# Patient Record
Sex: Female | Born: 1975 | Hispanic: Yes | Marital: Married | Smoking: Never smoker
Health system: Southern US, Community
[De-identification: ages and names within clinical notes are randomized; demographics above are authoritative.]

## PROBLEM LIST (undated history)

## (undated) DIAGNOSIS — E785 Hyperlipidemia, unspecified: Secondary | ICD-10-CM

## (undated) DIAGNOSIS — N83202 Unspecified ovarian cyst, left side: Secondary | ICD-10-CM

## (undated) DIAGNOSIS — L732 Hidradenitis suppurativa: Secondary | ICD-10-CM

## (undated) DIAGNOSIS — N83201 Unspecified ovarian cyst, right side: Secondary | ICD-10-CM

## (undated) DIAGNOSIS — I8390 Asymptomatic varicose veins of unspecified lower extremity: Secondary | ICD-10-CM

## (undated) DIAGNOSIS — J329 Chronic sinusitis, unspecified: Secondary | ICD-10-CM

## (undated) DIAGNOSIS — K219 Gastro-esophageal reflux disease without esophagitis: Secondary | ICD-10-CM

## (undated) HISTORY — DX: Chronic sinusitis, unspecified: J32.9

## (undated) HISTORY — DX: Unspecified ovarian cyst, left side: N83.202

## (undated) HISTORY — DX: Asymptomatic varicose veins of unspecified lower extremity: I83.90

## (undated) HISTORY — DX: Unspecified ovarian cyst, right side: N83.201

## (undated) HISTORY — DX: Hyperlipidemia, unspecified: E78.5

## (undated) HISTORY — PX: NO PAST SURGERIES: SHX2092

## (undated) HISTORY — DX: Hidradenitis suppurativa: L73.2

---

## 2013-05-22 ENCOUNTER — Ambulatory Visit: Payer: Self-pay

## 2013-06-02 ENCOUNTER — Other Ambulatory Visit (HOSPITAL_COMMUNITY): Payer: Self-pay | Admitting: *Deleted

## 2013-06-02 DIAGNOSIS — N6320 Unspecified lump in the left breast, unspecified quadrant: Secondary | ICD-10-CM

## 2013-06-03 ENCOUNTER — Ambulatory Visit (HOSPITAL_COMMUNITY)
Admission: RE | Admit: 2013-06-03 | Discharge: 2013-06-03 | Disposition: A | Discharge status: Home / Self Care | Payer: Self-pay | Source: Ambulatory Visit | Attending: Obstetrics and Gynecology | Admitting: Obstetrics and Gynecology

## 2013-06-03 ENCOUNTER — Encounter (INDEPENDENT_AMBULATORY_CARE_PROVIDER_SITE_OTHER): Payer: Self-pay

## 2013-06-03 ENCOUNTER — Encounter (HOSPITAL_COMMUNITY): Payer: Self-pay

## 2013-06-03 VITALS — BP 98/60 | Temp 97.7°F | Ht <= 58 in | Wt 139.2 lb

## 2013-06-03 DIAGNOSIS — Z1239 Encounter for other screening for malignant neoplasm of breast: Secondary | ICD-10-CM

## 2013-06-03 DIAGNOSIS — M79622 Pain in left upper arm: Secondary | ICD-10-CM | POA: Insufficient documentation

## 2013-06-03 NOTE — Progress Notes (Signed)
Complaints of left axillary and outer breast pain that comes and goes. Patient rated pain at a 7 out of 10 stating it is worse at night.  Pap Smear:  Pap smear not completed today. [place] June 2012 in [place] and normal per patient. Per patient no history of an abnormal Pap smear. No Pap smear results in EPIC.  Physical exam: Breasts Breasts symmetrical. No skin abnormalities bilateral breasts. No nipple retraction bilateral breasts. No nipple discharge bilateral breasts. No lymphadenopathy. No lumps palpated right breast. Palpated a possible swollen lymph node vs lump left axilla. Complaints of tenderness when palpated left axilla and outer breast. [facility] for [facility] and possible left breast ultrasound. Appointment scheduled for Wednesday, June 11, 2013 at 1515.  Pelvic/Bimanual No Pap smear completed today since last Pap smear was June 2012. Pap smear not indicated per [facility] guidelines.

## 2013-06-03 NOTE — Patient Instructions (Signed)
Taught Denise CrazeArgelia Ruiz how to perform BSE and gave educational materials to take home. Patient did not need a Pap smear today due to last Pap smear was in June 2012 per patient. Let her know [facility] will cover Pap smears every 3 years unless has a history of abnormal Pap smears. Told patient her Pap smear is due this June 2015 and she can have done for free through [facility]. Told patient she can call Sabrina to schedule her Pap smear through [facility]. [facility] for [facility] and possible left breast ultrasound. Appointment scheduled for Wednesday, June 11, 2013 at 1515. Patient aware of appointment and will be there.  Denise CrazeArgelia Ruiz verbalized understanding.  Brannock, Kathaleen Maserhristine Poll, RN 11:18 AM

## 2013-06-11 ENCOUNTER — Ambulatory Visit
Admission: RE | Admit: 2013-06-11 | Discharge: 2013-06-11 | Disposition: A | Discharge status: Home / Self Care | Payer: No Typology Code available for payment source | Source: Ambulatory Visit | Attending: Obstetrics and Gynecology | Admitting: Obstetrics and Gynecology

## 2013-06-11 DIAGNOSIS — N6320 Unspecified lump in the left breast, unspecified quadrant: Secondary | ICD-10-CM

## 2013-09-09 ENCOUNTER — Ambulatory Visit (HOSPITAL_COMMUNITY)
Admission: RE | Admit: 2013-09-09 | Discharge: 2013-09-09 | Disposition: A | Discharge status: Home / Self Care | Payer: Self-pay | Source: Ambulatory Visit | Attending: Obstetrics and Gynecology | Admitting: Obstetrics and Gynecology

## 2013-09-09 ENCOUNTER — Encounter (HOSPITAL_COMMUNITY): Payer: Self-pay

## 2013-09-09 VITALS — BP 108/64 | Temp 98.4°F | Ht <= 58 in | Wt 141.4 lb

## 2013-09-09 DIAGNOSIS — Z01419 Encounter for gynecological examination (general) (routine) without abnormal findings: Secondary | ICD-10-CM

## 2013-09-09 NOTE — Progress Notes (Signed)
Complaints of ovarian cysts.  Pap Smear:    Pap smear completed today. [place] Pap smear was June 2012 in [place] and normal per patient. Per patient has no history of an abnormal Pap smear. No Pap smear results in EPIC.     Pelvic/Bimanual   Ext Genitalia No lesions, no swelling and no discharge observed on external genitalia.         Vagina Vagina pink and normal texture. No lesions or discharge observed in vagina.          Cervix Cervix is present. Cervix pink and of normal texture. No discharge observed.     Uterus Uterus is present and palpable. Uterus in normal position and normal size.        Adnexae Bilateral ovaries present and palpable. Complaints of tenderness when palpated left ovary. No complaints of tenderness right ovary.    Rectovaginal No rectal exam completed today since patient had no rectal complaints. No skin abnormalities observed on exam.

## 2013-09-09 NOTE — Patient Instructions (Signed)
Informed patient that [facility] will cover Pap smears every 3 years unless has a history of abnormal Pap smears. Let patient know will follow up with her within the next couple weeks with results of Pap smear by phone. Told patient will [facility] for follow-up of left ovarian tenderness and that the Clinics will call her with appointment. Denise Ruiz verbalized understanding.  Brannock, Kathaleen Maserhristine Poll, RN 2:16 PM

## 2013-09-11 LAB — CYTOLOGY - PAP

## 2013-09-12 ENCOUNTER — Telehealth (HOSPITAL_COMMUNITY): Payer: Self-pay | Admitting: *Deleted

## 2013-09-12 NOTE — Telephone Encounter (Signed)
Telephoned patient at home # and discussed normal pap smear results. Next pap smear due in 3 years. Advised [facility] will contact about appointment. Patient voiced understanding. Used interpreter Delorise RoyalsJulie Sowell.

## 2013-09-19 ENCOUNTER — Encounter: Payer: Self-pay | Admitting: Obstetrics & Gynecology

## 2013-10-23 ENCOUNTER — Encounter: Payer: Self-pay | Admitting: Obstetrics & Gynecology

## 2013-10-23 ENCOUNTER — Ambulatory Visit (INDEPENDENT_AMBULATORY_CARE_PROVIDER_SITE_OTHER): Payer: Self-pay | Admitting: Obstetrics & Gynecology

## 2013-10-23 VITALS — BP 110/73 | HR 74 | Temp 98.2°F | Ht 60.0 in | Wt 142.3 lb

## 2013-10-23 DIAGNOSIS — R102 Pelvic and perineal pain unspecified side: Secondary | ICD-10-CM | POA: Insufficient documentation

## 2013-10-23 DIAGNOSIS — N949 Unspecified condition associated with female genital organs and menstrual cycle: Secondary | ICD-10-CM

## 2013-10-23 NOTE — Progress Notes (Signed)
Patient ID: Denise Ruiz, female   DOB: 1975-03-31, 38 y.o.   MRN: [id number]  Chief Complaint  Patient presents with  . Ovarian Cyst    pt states that she has light periods that last 8 days; cramping on left side when get up from lying down to up position, pt feels like she needs to lean forward due to pain    HPI Denise Raiford Simmondseralta is a 38 y.o. female.  Normal pap done by [facility]   [id number] Patient's last menstrual period was 09/20/2013.   HPI Chief Complaint  Patient presents with  . Ovarian Cyst    pt states that she has light periods that last 8 days; cramping on left side when get up from lying down to up position, pt feels like she needs to lean forward due to pain   No past medical history on file.  No past surgical history on file.  Family History  Problem Relation Age of Onset  . Heart attack Paternal Aunt   . Heart attack Paternal Uncle   . Cancer Maternal Grandmother     liver  . Heart attack Paternal Aunt     Social History History  Substance Use Topics  . Smoking status: Never Smoker   . Smokeless tobacco: Never Used  . Alcohol Use: No    No Known Allergies  Current Outpatient Prescriptions  Medication Sig Dispense Refill  . Multiple Vitamin (MULTIVITAMIN) tablet Take 1 tablet by mouth daily.       No current facility-administered medications for this visit.    Review of Systems Review of Systems  Constitutional: Negative.   Respiratory: Negative.   Gastrointestinal: Negative.   Genitourinary: Positive for menstrual problem (premenstrual pain) and pelvic pain. Negative for dysuria and urgency.    Blood pressure 110/73, pulse 74, temperature 98.2 F (36.8 C), temperature source Oral, height 5' (1.524 m), weight 142 lb 4.8 oz (64.547 kg), last menstrual period 09/20/2013.  Physical Exam Physical Exam  Constitutional: She is oriented to person, place, and time. She appears well-developed. No distress.  Pulmonary/Chest: Effort normal. No respiratory  distress.  Abdominal: Soft. She exhibits no mass. There is no tenderness.  Genitourinary:  Pelvic exam: normal external genitalia, vulva, vagina, cervix, uterus and adnexa.   Neurological: She is alert and oriented to person, place, and time.  Skin: Skin is warm and dry.  Psychiatric: She has a normal mood and affect. Her behavior is normal.    Data Reviewed Mild pelvic pain and dysmenorrhea. STD probe sent  Assessment    Mild pelvic pain and dysmenorrhea. STD probe sent    Plan    Lab result and ordered pelvic US, RTC 4 weeks        ARNOLD,JAMES 10/23/2013, 2:39 PM

## 2013-10-24 LAB — GC/CHLAMYDIA PROBE AMP
CT Probe RNA: NEGATIVE
GC Probe RNA: NEGATIVE

## 2013-10-28 ENCOUNTER — Ambulatory Visit (HOSPITAL_COMMUNITY): Payer: Self-pay

## 2014-01-26 ENCOUNTER — Encounter: Payer: Self-pay | Admitting: Obstetrics & Gynecology

## 2014-03-27 NOTE — L&D Delivery Note (Cosign Needed)
Delivery Note  Patient presented the evening of 12/23 in active labor. She progressed without augmentation. She experienced intermittent late decels and then at around 8:30 AM day of delivery recurrent late decels. Terbutaline was given and FHTs improved. She again experienced recurrent late decels and was found to have an anterior lip which she was able to push past. Meconium-stained fluid was noted at the onset of pushing. GBS positive, adequately treated.  At 10:10 AM a viable female was delivered via Vaginal, Spontaneous Delivery (Presentation: OA ).  APGAR: 6/8; weight 3745 g.   Placenta status: Intact, Spontaneous.  Cord:  with the following complications: shoulder dystocia.  Cord pH: 7.183  After delivery of the head downward traction failed to deliver the anterior shoulder. Dystocia was called, timer was started, patient was moved to bottom of her bed and placed in McRobert's position, and suprapubic pressure was applied. With downward traction the anterior shoulder was delivered and with pressure posterior to the anterior shoulder the fetus was rotated to the maternal right, which succeeded further deliver of the anterior shoulder, and then delivery proceeded normally.  Anesthesia: Epidural  Episiotomy:  none Lacerations:  none Est. Blood Loss (mL):  250 ml  Mom to postpartum.  Baby to nursery.  Denise Ruiz 03/20/2015, 10:29 AM

## 2014-06-10 ENCOUNTER — Other Ambulatory Visit (HOSPITAL_COMMUNITY): Payer: Self-pay | Admitting: *Deleted

## 2014-06-10 DIAGNOSIS — N6320 Unspecified lump in the left breast, unspecified quadrant: Secondary | ICD-10-CM

## 2014-06-11 ENCOUNTER — Encounter (HOSPITAL_COMMUNITY): Payer: Self-pay

## 2014-06-11 ENCOUNTER — Ambulatory Visit (HOSPITAL_COMMUNITY)
Admission: RE | Admit: 2014-06-11 | Discharge: 2014-06-11 | Disposition: A | Discharge status: Home / Self Care | Payer: Self-pay | Source: Ambulatory Visit | Attending: Obstetrics and Gynecology | Admitting: Obstetrics and Gynecology

## 2014-06-11 ENCOUNTER — Ambulatory Visit
Admission: RE | Admit: 2014-06-11 | Discharge: 2014-06-11 | Disposition: A | Discharge status: Home / Self Care | Payer: No Typology Code available for payment source | Source: Ambulatory Visit | Attending: Obstetrics and Gynecology | Admitting: Obstetrics and Gynecology

## 2014-06-11 VITALS — BP 110/64 | Temp 97.8°F | Ht <= 58 in | Wt 143.0 lb

## 2014-06-11 DIAGNOSIS — Z1239 Encounter for other screening for malignant neoplasm of breast: Secondary | ICD-10-CM

## 2014-06-11 DIAGNOSIS — N644 Mastodynia: Secondary | ICD-10-CM

## 2014-06-11 DIAGNOSIS — N6320 Unspecified lump in the left breast, unspecified quadrant: Secondary | ICD-10-CM

## 2014-06-11 HISTORY — DX: Hyperlipidemia, unspecified: E78.5

## 2014-06-11 NOTE — Progress Notes (Addendum)
Complaints of left breast and axillary pain x 2 months that comes and goes. Patient rates pain at a 3 out of 10.  Pap Smear:  Pap smear not completed today. [facility]. Per patient has no history of an abnormal Pap smear. Last Pap smear result is in EPIC.  Physical exam: Breasts Breasts symmetrical. No skin abnormalities bilateral breasts. No nipple retraction bilateral breasts. No nipple discharge bilateral breasts. No lymphadenopathy. No lumps palpated bilateral breasts. Complaints of left outer breast and axillary tenderness on exam. [facility]. Appointment scheduled for Thursday, June 11, 2014 at 1350.       Pelvic/Bimanual No Pap smear completed today since last Pap smear was 09/08/2013. Pap smear not indicated per BCCCP guidelines.   Used interpreter Nira ConnJulia Sowell.

## 2014-06-11 NOTE — Patient Instructions (Signed)
Education materials on self-breast awareness given. Explained to  Denise Ruiz that she did not need a Pap smear today due to last Pap smear was 09/08/2013. Let her know [facility] will cover Pap smears every 3 years unless has a history of abnormal Pap smears. [facility] for diagnostic mamm[facility]. Appointment scheduled for Thursday, June 11, 2014 at 1350. Patient aware of appointment and will be there. Denise Ruiz verbalized understanding.  Kerie Badger, Kathaleen Maserhristine Poll, RN 11:07 AM

## 2014-06-11 NOTE — Addendum Note (Signed)
Encounter addended by: Myldred Raju P Karthik Whittinghill, RN on: 06/11/2014  1:09 PM<BR>     Documentation filed: Notes Section

## 2014-08-25 ENCOUNTER — Ambulatory Visit (INDEPENDENT_AMBULATORY_CARE_PROVIDER_SITE_OTHER): Payer: Self-pay | Admitting: Obstetrics & Gynecology

## 2014-08-25 ENCOUNTER — Encounter: Payer: Self-pay | Admitting: Obstetrics & Gynecology

## 2014-08-25 VITALS — BP 112/68 | HR 71 | Temp 98.4°F | Wt 142.9 lb

## 2014-08-25 DIAGNOSIS — Z3491 Encounter for supervision of normal pregnancy, unspecified, first trimester: Secondary | ICD-10-CM

## 2014-08-25 DIAGNOSIS — O09529 Supervision of elderly multigravida, unspecified trimester: Secondary | ICD-10-CM | POA: Insufficient documentation

## 2014-08-25 DIAGNOSIS — Z3481 Encounter for supervision of other normal pregnancy, first trimester: Secondary | ICD-10-CM

## 2014-08-25 DIAGNOSIS — O3680X1 Pregnancy with inconclusive fetal viability, fetus 1: Secondary | ICD-10-CM

## 2014-08-25 LAB — POCT URINALYSIS DIP (DEVICE)
BILIRUBIN URINE: NEGATIVE
Glucose, UA: NEGATIVE mg/dL
Hgb urine dipstick: NEGATIVE
Ketones, ur: NEGATIVE mg/dL
LEUKOCYTES UA: NEGATIVE
Nitrite: NEGATIVE
PH: 5.5 (ref 5.0–8.0)
Protein, ur: NEGATIVE mg/dL
Specific Gravity, Urine: <=1.005 (ref 1.005–1.030)
Urobilinogen, UA: 0.2 mg/dL (ref 0.0–1.0)

## 2014-08-25 MED ORDER — PRENATAL VITAMINS 0.8 MG PO TABS: 1.0000 | ORAL_TABLET | Freq: Every day | ORAL | Status: DC

## 2014-08-25 NOTE — Progress Notes (Signed)
   Subjective: new OB    Denise Ruiz is a [id number] 3848w2d being seen today for her first obstetrical visit.  Her obstetrical history is significant for advanced maternal age. Patient does intend to breast feed. Pregnancy history fully reviewed.  Patient reports cramping.  Filed Vitals:   08/25/14 1511  BP: 112/68  Pulse: 71  Temp: 98.4 F (36.9 C)  Weight: 142 lb 14.4 oz (64.819 kg)    HISTORY: OB History  Gravida Para Term Preterm AB SAB TAB Ectopic Multiple Living  6 3 3  2 2    6     # Outcome Date GA Lbr Len/2nd Weight Sex Delivery Anes PTL Lv  6 Current           5 Term 10/01/10 7469w0d  7 lb 4 oz (3.289 kg) F Vag-Spont   Y  4 Term 02/16/09 5669w0d  7 lb 10 oz (3.459 kg) F Vag-Spont   Y  3 Term 11/11/02 1069w0d  6 lb 15 oz (3.147 kg) F Vag-Spont   Y  2 SAB           1 SAB              Past Medical History  Diagnosis Date  . Hyperlipemia   . Varicose vein    History reviewed. No pertinent past surgical history. Family History  Problem Relation Age of Onset  . Heart attack Paternal Aunt   . Heart attack Paternal Uncle   . Cancer Maternal Grandmother     liver  . Heart attack Paternal Aunt      Exam    Uterus:     Pelvic Exam:    Perineum: No Hemorrhoids   Vulva: normal   Vagina:  normal mucosa   pH:     Cervix: no lesions   Adnexa: normal adnexa   Bony Pelvis: average  System: Breast:  normal appearance, no masses or tenderness   Skin: normal coloration and turgor, no rashes    Neurologic: oriented, normal mood   Extremities: normal strength, tone, and muscle mass, varicose vs   HEENT PERRLA and sclera clear, anicteric   Mouth/Teeth mucous membranes moist, pharynx normal without lesions   Neck supple   Cardiovascular: regular rate and rhythm, no murmurs or gallops   Respiratory:  appears well, vitals normal, no respiratory distress, acyanotic, normal RR, neck free of mass or lymphadenopathy, chest clear, no wheezing, crepitations, rhonchi,  normal symmetric air entry   Abdomen: soft, non-tender; bowel sounds normal; no masses,  no organomegaly   Urinary: urethral meatus normal      Assessment:    Pregnancy: [id number] Patient Active Problem List   Diagnosis Date Noted  . Encounter for supervision of high risk multigravida of advanced maternal age, antepartum 08/25/2014  . Left axillary pain 06/03/2013        Plan:     Initial labs drawn. Prenatal vitamins. Problem list reviewed and updated. Genetic Screening discussed First Screen: ordered.  Ultrasound discussed; fetal survey: 18+ weeks.  Follow up in 4 weeks. 50% of 30 min visit spent on counseling and coordination of care.  Increased risk of chromosomal anomalies discussed   Gardner Servantes 08/25/2014

## 2014-08-25 NOTE — Progress Notes (Signed)
Bedside US for viability = FHR 170 per PW doppler, FM present.

## 2014-08-25 NOTE — Patient Instructions (Signed)
Segundo trimestre de embarazo (Second Trimester of Pregnancy) El segundo trimestre va desde la semana13 hasta la 28, desde el cuarto hasta el sexto mes, y suele ser el momento en el que mejor se siente. Su organismo se ha adaptado a estar embarazada y comienza a sentirse fsicamente mejor. En general, las nuseas matutinas han disminuido o han desaparecido completamente, p El segundo trimestre es tambin la poca en la que el feto se desarrolla rpidamente. Hacia el final del sexto mes, el feto mide aproximadamente 9pulgadas (23cm) y pesa alrededor de 1 libras (700g). Es probable que sienta que el beb se mueve (da pataditas) entre las 18 y 20semanas del embarazo. CAMBIOS EN EL ORGANISMO Su organismo atraviesa por muchos cambios durante el embarazo, y estos varan de una mujer a otra.   Seguir aumentando de peso. Notar que la parte baja del abdomen sobresale.  Podrn aparecer las primeras estras en las caderas, el abdomen y las mamas.  Es posible que tenga dolores de cabeza que pueden aliviarse con los medicamentos que su mdico autorice.  Tal vez tenga necesidad de orinar con ms frecuencia porque el feto est ejerciendo presin sobre la vejiga.  Debido al embarazo podr sentir acidez estomacal con frecuencia.  Puede estar estreida, ya que ciertas hormonas enlentecen los movimientos de los msculos que empujan los desechos a travs de los intestinos.  Pueden aparecer hemorroides o abultarse e hincharse las venas (venas varicosas).  Puede tener dolor de espalda que se debe al aumento de peso y a que las hormonas del embarazo relajan las articulaciones entre los huesos de la pelvis, y como consecuencia de la modificacin del peso y los msculos que mantienen el equilibrio.  Las mamas seguirn creciendo y le dolern.  Las encas pueden sangrar y estar sensibles al cepillado y al hilo dental.  Pueden aparecer zonas oscuras o manchas (cloasma, mscara del embarazo) en el rostro que  probablemente se atenuarn despus del nacimiento del beb.  Es posible que se forme una lnea oscura desde el ombligo hasta la zona del pubis (linea nigra) que probablemente se atenuarn despus del nacimiento del beb.  Tal vez haya cambios en el cabello que pueden incluir su engrosamiento, crecimiento rpido y cambios en la textura. Adems, a algunas mujeres se les cae el cabello durante o despus del embarazo, o tienen el cabello seco o fino. Lo ms probable es que el cabello se le normalice despus del nacimiento del beb. QU DEBE ESPERAR EN LAS CONSULTAS PRENATALES Durante una visita prenatal de rutina:  La pesarn para asegurarse de que usted y el feto estn creciendo normalmente.  Le tomarn la presin arterial.  Le medirn el abdomen para controlar el desarrollo del beb.  Se escucharn los latidos cardacos fetales.  Se evaluarn los resultados de los estudios solicitados en visitas anteriores. El mdico puede preguntarle lo siguiente:  Cmo se siente.  Si siente los movimientos del beb.  Si ha tenido sntomas anormales, como prdida de lquido, sangrado, dolores de cabeza intensos o clicos abdominales.  Si tiene alguna pregunta. Otros estudios que podrn realizarse durante el segundo trimestre incluyen lo siguiente:  Anlisis de sangre para detectar:  Concentraciones de hierro bajas (anemia).  Diabetes gestacional (entre la semana 24 y la 28).  Anticuerpos Rh.  Anlisis de orina para detectar infecciones, diabetes o protenas en la orina.  Una ecografa para confirmar que el beb crece y se desarrolla correctamente.  Una amniocentesis para diagnosticar posibles problemas genticos.  Estudios del feto para descartar espina   bfida y sndrome de Down. INSTRUCCIONES PARA EL CUIDADO EN EL HOGAR   Evite fumar, consumir hierbas, beber alcohol y tomar frmacos que no le hayan recetado. Estas sustancias qumicas afectan la formacin y el desarrollo del beb.  Siga  las indicaciones del mdico en relacin con el uso de medicamentos. Durante el embarazo, hay medicamentos que son seguros de tomar y otros que no.  Haga actividad fsica solo en la forma indicada por el mdico. Sentir clicos uterinos es un buen signo para detener la actividad fsica.  Contine comiendo alimentos que sanos con regularidad.  Use un sostn que le brinde buen soporte si le duelen las mamas.  No se d baos de inmersin en agua caliente, baos turcos ni saunas.  Colquese el cinturn de seguridad cuando conduzca.  No coma carne cruda ni queso sin cocinar; evite el contacto con las bandejas sanitarias de los gatos y la tierra que estos animales usan. Estos elementos contienen grmenes que pueden causar defectos congnitos en el beb.  Tome las vitaminas prenatales.  Si est estreida, pruebe un laxante suave (si el mdico lo autoriza). Consuma ms alimentos ricos en fibra, como vegetales y frutas frescos y cereales integrales. Beba gran cantidad de lquido para mantener la orina de tono claro o color amarillo plido.  Dese baos de asiento con agua tibia para aliviar el dolor o las molestias causadas por las hemorroides. Use una crema para las hemorroides si el mdico la autoriza.  Si tiene venas varicosas, use medias de descanso. Eleve los pies durante 15minutos, 3 o 4veces por da. Limite la cantidad de sal en su dieta.  No levante objetos pesados, use zapatos de tacones bajos y mantenga una buena postura.  Descanse con las piernas elevadas si tiene calambres o dolor de cintura.  Visite a su dentista si an no lo ha hecho durante el embarazo. Use un cepillo de dientes blando para higienizarse los dientes y psese el hilo dental con suavidad.  Puede seguir manteniendo relaciones sexuales, a menos que el mdico le indique lo contrario.  Concurra a todas las visitas prenatales segn las indicaciones de su mdico. SOLICITE ATENCIN MDICA SI:   Tiene mareos.  Siente  clicos leves, presin en la pelvis o dolor persistente en el abdomen.  Tiene nuseas, vmitos o diarrea persistentes.  Tiene secrecin vaginal con mal olor.  Siente dolor al orinar. SOLICITE ATENCIN MDICA DE INMEDIATO SI:   Tiene fiebre.  Tiene una prdida de lquido por la vagina.  Tiene sangrado o pequeas prdidas vaginales.  Siente dolor intenso o clicos en el abdomen.  Sube o baja de peso rpidamente.  Tiene dificultad para respirar y siente dolor de pecho.  Sbitamente se le hinchan mucho el rostro, las manos, los tobillos, los pies o las piernas.  No ha sentido los movimientos del beb durante una hora.  Siente un dolor de cabeza intenso que no se alivia con medicamentos.  Hay cambios en la visin. Document Released: 12/21/2004 Document Revised: 03/18/2013 Exit[employer] Patient Information 2015 [employer]. This information is not intended to replace advice given to you by your health care provider. Make sure you discuss any questions you have with your health care provider.  

## 2014-08-25 NOTE — Progress Notes (Signed)
Initial labs today.  C/o occasional pelvic cramping.   Advised patient start PNV.  Cheryl-lynn Alas used as interpreter for this encounter.

## 2014-08-26 LAB — PRENATAL PROFILE (SOLSTAS)
ANTIBODY SCREEN: NEGATIVE
BASOS PCT: 0 % (ref 0–1)
Basophils Absolute: 0 10*3/uL (ref 0.0–0.1)
EOS ABS: 0.2 10*3/uL (ref 0.0–0.7)
Eosinophils Relative: 2 % (ref 0–5)
HCT: 37.3 % (ref 36.0–46.0)
HIV: NONREACTIVE
Hemoglobin: 13.1 g/dL (ref 12.0–15.0)
Hepatitis B Surface Ag: NEGATIVE
Lymphocytes Relative: 25 % (ref 12–46)
Lymphs Abs: 2 10*3/uL (ref 0.7–4.0)
MCH: 30.5 pg (ref 26.0–34.0)
MCHC: 35.1 g/dL (ref 30.0–36.0)
MCV: 86.9 fL (ref 78.0–100.0)
MONOS PCT: 5 % (ref 3–12)
MPV: 9.4 fL (ref 8.6–12.4)
Monocytes Absolute: 0.4 10*3/uL (ref 0.1–1.0)
Neutro Abs: 5.4 10*3/uL (ref 1.7–7.7)
Neutrophils Relative %: 68 % (ref 43–77)
Platelets: 237 10*3/uL (ref 150–400)
RBC: 4.29 MIL/uL (ref 3.87–5.11)
RDW: 13.7 % (ref 11.5–15.5)
RH TYPE: POSITIVE
Rubella: 2.4 Index — ABNORMAL HIGH (ref <0.90)
WBC: 8 10*3/uL (ref 4.0–10.5)

## 2014-08-26 LAB — GC/CHLAMYDIA PROBE AMP
CT Probe RNA: NEGATIVE
GC PROBE AMP APTIMA: NEGATIVE

## 2014-08-27 LAB — PRESCRIPTION MONITORING PROFILE (19 PANEL)
Amphetamine/Meth: NEGATIVE ng/mL
Barbiturate Screen, Urine: NEGATIVE ng/mL
Benzodiazepine Screen, Urine: NEGATIVE ng/mL
Buprenorphine, Urine: NEGATIVE ng/mL
CARISOPRODOL, URINE: NEGATIVE ng/mL
COCAINE METABOLITES: NEGATIVE ng/mL
Cannabinoid Scrn, Ur: NEGATIVE ng/mL
Creatinine, Urine: 13.29 mg/dL — ABNORMAL LOW (ref >20.0)
FENTANYL URINE: NEGATIVE ng/mL
MDMA URINE: NEGATIVE ng/mL
Meperidine, Ur: NEGATIVE ng/mL
Methadone Screen, Urine: NEGATIVE ng/mL
Methaqualone: NEGATIVE ng/mL
Nitrites, Initial: NEGATIVE ug/mL
Opiate Screen, Urine: NEGATIVE ng/mL
Oxycodone Screen, Ur: NEGATIVE ng/mL
PH URINE, INITIAL: 6.3 pH (ref 4.5–8.9)
PHENCYCLIDINE, UR: NEGATIVE ng/mL
Propoxyphene: NEGATIVE ng/mL
Tapentadol, urine: NEGATIVE ng/mL
Tramadol Scrn, Ur: NEGATIVE ng/mL
Zolpidem, Urine: NEGATIVE ng/mL

## 2014-08-27 LAB — CULTURE, OB URINE
Colony Count: NO GROWTH
ORGANISM ID, BACTERIA: NO GROWTH

## 2014-08-28 ENCOUNTER — Other Ambulatory Visit: Payer: Self-pay | Admitting: Obstetrics & Gynecology

## 2014-08-28 ENCOUNTER — Ambulatory Visit (HOSPITAL_COMMUNITY)
Admission: RE | Admit: 2014-08-28 | Discharge: 2014-08-28 | Disposition: A | Discharge status: Home / Self Care | Payer: Self-pay | Source: Ambulatory Visit | Attending: Obstetrics & Gynecology | Admitting: Obstetrics & Gynecology

## 2014-08-28 DIAGNOSIS — Z3687 Encounter for antenatal screening for uncertain dates: Secondary | ICD-10-CM

## 2014-08-28 DIAGNOSIS — Z3481 Encounter for supervision of other normal pregnancy, first trimester: Secondary | ICD-10-CM

## 2014-08-28 DIAGNOSIS — Z3A11 11 weeks gestation of pregnancy: Secondary | ICD-10-CM | POA: Insufficient documentation

## 2014-08-28 DIAGNOSIS — Z36 Encounter for antenatal screening of mother: Secondary | ICD-10-CM | POA: Insufficient documentation

## 2014-09-01 ENCOUNTER — Other Ambulatory Visit: Payer: Self-pay | Admitting: Obstetrics & Gynecology

## 2014-09-01 DIAGNOSIS — O09529 Supervision of elderly multigravida, unspecified trimester: Secondary | ICD-10-CM

## 2014-09-01 NOTE — Progress Notes (Signed)
Pt scheduled for 1 hour gtt on 09/04/14 at 0800.

## 2014-09-04 ENCOUNTER — Other Ambulatory Visit: Payer: Self-pay

## 2014-09-04 DIAGNOSIS — Z3492 Encounter for supervision of normal pregnancy, unspecified, second trimester: Secondary | ICD-10-CM

## 2014-09-05 LAB — GLUCOSE TOLERANCE, 1 HOUR (50G) W/O FASTING: Glucose, 1 Hour GTT: 137 mg/dL (ref 70–140)

## 2014-09-07 ENCOUNTER — Telehealth: Payer: Self-pay | Admitting: General Practice

## 2014-09-07 NOTE — Telephone Encounter (Signed)
Called patient with Genella Rife for interpreter, informed patient of results and need for 3 hr gtt. Patient verbalized understanding and states she can come Wednesday 6/15 @ 7:45am. Patient aware to come in fasting. Patient had no questions

## 2014-09-09 ENCOUNTER — Other Ambulatory Visit: Payer: Self-pay

## 2014-09-09 DIAGNOSIS — R7309 Other abnormal glucose: Secondary | ICD-10-CM

## 2014-09-10 LAB — GLUCOSE TOLERANCE, 3 HOURS
GLUCOSE, 2 HOUR-GESTATIONAL: 141 mg/dL (ref 70–164)
Glucose Tolerance, 1 hour: 151 mg/dL (ref 70–189)
Glucose Tolerance, Fasting: 79 mg/dL (ref 70–104)
Glucose, GTT - 3 Hour: 115 mg/dL (ref 70–144)

## 2014-09-22 ENCOUNTER — Ambulatory Visit (INDEPENDENT_AMBULATORY_CARE_PROVIDER_SITE_OTHER): Payer: Self-pay | Admitting: Obstetrics & Gynecology

## 2014-09-22 ENCOUNTER — Encounter: Payer: Self-pay | Admitting: Obstetrics & Gynecology

## 2014-09-22 VITALS — BP 105/59 | HR 74 | Temp 98.2°F | Wt 144.5 lb

## 2014-09-22 DIAGNOSIS — O09529 Supervision of elderly multigravida, unspecified trimester: Secondary | ICD-10-CM

## 2014-09-22 DIAGNOSIS — O2200 Varicose veins of lower extremity in pregnancy, unspecified trimester: Secondary | ICD-10-CM

## 2014-09-22 LAB — POCT URINALYSIS DIP (DEVICE)
BILIRUBIN URINE: NEGATIVE
Glucose, UA: NEGATIVE mg/dL
Hgb urine dipstick: NEGATIVE
KETONES UR: NEGATIVE mg/dL
NITRITE: NEGATIVE
Protein, ur: NEGATIVE mg/dL
SPECIFIC GRAVITY, URINE: 1.02 (ref 1.005–1.030)
UROBILINOGEN UA: 0.2 mg/dL (ref 0.0–1.0)
pH: 7 (ref 5.0–8.0)

## 2014-09-22 NOTE — Progress Notes (Signed)
Subjective:  Denise Ruiz is a 39 y.o. [phone] at 10539w3d being seen today for ongoing prenatal care.  Patient reports worsening varicose veins.  Contractions: Not present.  Vag. Bleeding: None. Movement: Present. Denies leaking of fluid.   The following portions of the patient's history were reviewed and updated as appropriate: allergies, current medications, past family history, past medical history, past social history, past surgical history and problem list.   Objective:   Filed Vitals:   09/22/14 1317  BP: 105/59  Pulse: 74  Temp: 98.2 F (36.8 C)  Weight: 144 lb 8 oz (65.545 kg)    Fetal Status: Fetal Heart Rate (bpm): 145   Movement: Present     General:  Alert, oriented and cooperative. Patient is in no acute distress.  Skin: Skin is warm and dry. No rash noted.   Cardiovascular: Normal heart rate noted  Respiratory: Normal respiratory effort, no problems with respiration noted  Abdomen: Soft, gravid, appropriate for gestational age. Pain/Pressure: Present     Vaginal: Vag. Bleeding: None.       Cervix: Not evaluated        Extremities: Normal range of motion.  Edema: None  Mental Status: Normal mood and affect. Normal behavior. Normal judgment and thought content.   Urinalysis: Urine Protein: Negative Urine Glucose: Negative  Assessment and Plan:  Pregnancy: [id number] at 7339w3d  1. Encounter for supervision of high risk multigravida of advanced maternal age, antepartum  - US OB Comp + [address]; Future  2. Varicose veins- rec Ted hose or other support stockings to LE Please refer to After Visit Summary for other counseling recommendations.   Return in about 4 weeks (around 10/20/2014).   Willodean Rosenthalarolyn Harraway-Smith, MD

## 2014-09-22 NOTE — Patient Instructions (Signed)
Venas varicosas (Varicose Veins) Las venas varicosas son venas que se han agrandado y se tornan sinuosas.  CAUSAS  Este trastorno es el resultado de un malfuncionamiento de las vlvulas de las venas. Las vlvulas de las venas ayudan al retorno de la sangre desde las piernas hacia el corazn. Si estas vlvulas se daan, el flujo sanguneo retorna hacia atrs y vuelve hacia las venas de la pierna, cerca de la piel. Esto hace que las venas se agranden debido al aumento de la presin en su interior. Es ms probable que desarrollen venas varicosas las personas que estn de pie durante mucho tiempo, las mujeres embarazadas o quienes tienen sobrepeso. SNTOMAS   Venas hinchadas, tortuosas, y azuladas, que se observan ms comnmente en las piernas.  Dolor en las piernas o sensacin de pesadez. Estos sntomas pueden empeorar hacia el final del da.  Hinchazn de las piernas.  Cambios en el color de la piel. DIAGNSTICO  Su mdico puede diagnosticarle venas varicosas con un examen de las piernas. Podr recomendarle una ecografa de las venas de sus piernas.  TRATAMIENTO  La mayora de las venas varicosas pueden tratarse en casa.Sin embargo, existen otros tratamientos para personas con sntomas persistentes o que desean tratar la apariencia de las venas varicosas. Entre estos tratamientos se incluyen:   Tratamiento lser de venas varicosas muy pequeas.  Medicamentos que se inyectan en la vena. Este medicamento endurece las paredes de la vena y la cierra. Se denomina escleroterapia. Luego, deber utilizar ropa o vendajes que apliquen presin.  Ciruga. INSTRUCCIONES PARA EL CUIDADO DOMICILIARIO  No permanezca sentado o de pie en una posicin durante largos perodos. No se siente con las piernas cruzadas. Descanse con las piernas levantadas durante el da.  Use medias elsticas o de soporte. No use prendas apretadas alrededor de las piernas, pelvis o cintura.  Camine todo lo posible para aumentar  el flujo de sangre.  Levante los pies de la cama por la noche, alrededor de 5 cm.  Si se ha cortado la piel que est sobre la vena y sangra, recustese con la pierna elevada y presione con un pao limpio hasta que la sangre se detenga. Luego coloque una venda sobre el corte. Consulte a su mdico si contina sangrando o necesita una sutura. SOLICITE ATENCIN MDICA SI:  La piel que rodea el tobillo comienza a lesionarse.  Presenta dolor, enrojecimiento, sensibilidad o hinchazn en la pierna, sobre la vena.  Siente molestias y dolor en la pierna. Document Released: 12/21/2004 Document Revised: 06/05/2011 ExitCare Patient Information 2015 [employer]. This information is not intended to replace advice given to you by your health care provider. Make sure you discuss any questions you have with your health care provider.  

## 2014-09-22 NOTE — Progress Notes (Signed)
C/o occasional cramping.  Raquel mora present as interpreter.

## 2014-10-30 ENCOUNTER — Ambulatory Visit (HOSPITAL_COMMUNITY)
Admission: RE | Admit: 2014-10-30 | Discharge: 2014-10-30 | Disposition: A | Discharge status: Home / Self Care | Payer: Self-pay | Source: Ambulatory Visit | Attending: Obstetrics & Gynecology | Admitting: Obstetrics & Gynecology

## 2014-10-30 ENCOUNTER — Other Ambulatory Visit: Payer: Self-pay | Admitting: Obstetrics & Gynecology

## 2014-10-30 DIAGNOSIS — O09529 Supervision of elderly multigravida, unspecified trimester: Secondary | ICD-10-CM | POA: Insufficient documentation

## 2014-11-11 ENCOUNTER — Ambulatory Visit (INDEPENDENT_AMBULATORY_CARE_PROVIDER_SITE_OTHER): Payer: Self-pay | Admitting: Family Medicine

## 2014-11-11 ENCOUNTER — Encounter: Payer: Self-pay | Admitting: Obstetrics & Gynecology

## 2014-11-11 VITALS — BP 108/68 | HR 87 | Wt 150.6 lb

## 2014-11-11 DIAGNOSIS — R12 Heartburn: Secondary | ICD-10-CM

## 2014-11-11 DIAGNOSIS — O26899 Other specified pregnancy related conditions, unspecified trimester: Secondary | ICD-10-CM

## 2014-11-11 DIAGNOSIS — O09529 Supervision of elderly multigravida, unspecified trimester: Secondary | ICD-10-CM

## 2014-11-11 LAB — POCT URINALYSIS DIP (DEVICE)
Bilirubin Urine: NEGATIVE
Glucose, UA: NEGATIVE mg/dL
Hgb urine dipstick: NEGATIVE
Ketones, ur: NEGATIVE mg/dL
LEUKOCYTES UA: NEGATIVE
NITRITE: NEGATIVE
Protein, ur: NEGATIVE mg/dL
SPECIFIC GRAVITY, URINE: 1.015 (ref 1.005–1.030)
UROBILINOGEN UA: 0.2 mg/dL (ref 0.0–1.0)
pH: 7 (ref 5.0–8.0)

## 2014-11-11 NOTE — Patient Instructions (Signed)
Segundo trimestre de embarazo (Second Trimester of Pregnancy) El segundo trimestre va desde la semana13 hasta la 28, desde el cuarto hasta el sexto mes, y suele ser el momento en el que mejor se siente. Su organismo se ha adaptado a estar embarazada y comienza a sentirse fsicamente mejor. En general, las nuseas matutinas han disminuido o han desaparecido completamente, p El segundo trimestre es tambin la poca en la que el feto se desarrolla rpidamente. Hacia el final del sexto mes, el feto mide aproximadamente 9pulgadas (23cm) y pesa alrededor de 1 libras (700g). Es probable que sienta que el beb se mueve (da pataditas) entre las 18 y 20semanas del embarazo. CAMBIOS EN EL ORGANISMO Su organismo atraviesa por muchos cambios durante el embarazo, y estos varan de una mujer a otra.   Seguir aumentando de peso. Notar que la parte baja del abdomen sobresale.  Podrn aparecer las primeras estras en las caderas, el abdomen y las mamas.  Es posible que tenga dolores de cabeza que pueden aliviarse con los medicamentos que su mdico autorice.  Tal vez tenga necesidad de orinar con ms frecuencia porque el feto est ejerciendo presin sobre la vejiga.  Debido al embarazo podr sentir acidez estomacal con frecuencia.  Puede estar estreida, ya que ciertas hormonas enlentecen los movimientos de los msculos que empujan los desechos a travs de los intestinos.  Pueden aparecer hemorroides o abultarse e hincharse las venas (venas varicosas).  Puede tener dolor de espalda que se debe al aumento de peso y a que las hormonas del embarazo relajan las articulaciones entre los huesos de la pelvis, y como consecuencia de la modificacin del peso y los msculos que mantienen el equilibrio.  Las mamas seguirn creciendo y le dolern.  Las encas pueden sangrar y estar sensibles al cepillado y al hilo dental.  Pueden aparecer zonas oscuras o manchas (cloasma, mscara del embarazo) en el rostro que  probablemente se atenuarn despus del nacimiento del beb.  Es posible que se forme una lnea oscura desde el ombligo hasta la zona del pubis (linea nigra) que probablemente se atenuarn despus del nacimiento del beb.  Tal vez haya cambios en el cabello que pueden incluir su engrosamiento, crecimiento rpido y cambios en la textura. Adems, a algunas mujeres se les cae el cabello durante o despus del embarazo, o tienen el cabello seco o fino. Lo ms probable es que el cabello se le normalice despus del nacimiento del beb. QU DEBE ESPERAR EN LAS CONSULTAS PRENATALES Durante una visita prenatal de rutina:  La pesarn para asegurarse de que usted y el feto estn creciendo normalmente.  Le tomarn la presin arterial.  Le medirn el abdomen para controlar el desarrollo del beb.  Se escucharn los latidos cardacos fetales.  Se evaluarn los resultados de los estudios solicitados en visitas anteriores. El mdico puede preguntarle lo siguiente:  Cmo se siente.  Si siente los movimientos del beb.  Si ha tenido sntomas anormales, como prdida de lquido, sangrado, dolores de cabeza intensos o clicos abdominales.  Si tiene alguna pregunta. Otros estudios que podrn realizarse durante el segundo trimestre incluyen lo siguiente:  Anlisis de sangre para detectar:  Concentraciones de hierro bajas (anemia).  Diabetes gestacional (entre la semana 24 y la 28).  Anticuerpos Rh.  Anlisis de orina para detectar infecciones, diabetes o protenas en la orina.  Una ecografa para confirmar que el beb crece y se desarrolla correctamente.  Una amniocentesis para diagnosticar posibles problemas genticos.  Estudios del feto para descartar espina   bfida y sndrome de Down. INSTRUCCIONES PARA EL CUIDADO EN EL HOGAR   Evite fumar, consumir hierbas, beber alcohol y tomar frmacos que no le hayan recetado. Estas sustancias qumicas afectan la formacin y el desarrollo del beb.  Siga  las indicaciones del mdico en relacin con el uso de medicamentos. Durante el embarazo, hay medicamentos que son seguros de tomar y otros que no.  Haga actividad fsica solo en la forma indicada por el mdico. Sentir clicos uterinos es un buen signo para detener la actividad fsica.  Contine comiendo alimentos que sanos con regularidad.  Use un sostn que le brinde buen soporte si le duelen las mamas.  No se d baos de inmersin en agua caliente, baos turcos ni saunas.  Colquese el cinturn de seguridad cuando conduzca.  No coma carne cruda ni queso sin cocinar; evite el contacto con las bandejas sanitarias de los gatos y la tierra que estos animales usan. Estos elementos contienen grmenes que pueden causar defectos congnitos en el beb.  Tome las vitaminas prenatales.  Si est estreida, pruebe un laxante suave (si el mdico lo autoriza). Consuma ms alimentos ricos en fibra, como vegetales y frutas frescos y cereales integrales. Beba gran cantidad de lquido para mantener la orina de tono claro o color amarillo plido.  Dese baos de asiento con agua tibia para aliviar el dolor o las molestias causadas por las hemorroides. Use una crema para las hemorroides si el mdico la autoriza.  Si tiene venas varicosas, use medias de descanso. Eleve los pies durante 15minutos, 3 o 4veces por da. Limite la cantidad de sal en su dieta.  No levante objetos pesados, use zapatos de tacones bajos y mantenga una buena postura.  Descanse con las piernas elevadas si tiene calambres o dolor de cintura.  Visite a su dentista si an no lo ha hecho durante el embarazo. Use un cepillo de dientes blando para higienizarse los dientes y psese el hilo dental con suavidad.  Puede seguir manteniendo relaciones sexuales, a menos que el mdico le indique lo contrario.  Concurra a todas las visitas prenatales segn las indicaciones de su mdico. SOLICITE ATENCIN MDICA SI:   Tiene mareos.  Siente  clicos leves, presin en la pelvis o dolor persistente en el abdomen.  Tiene nuseas, vmitos o diarrea persistentes.  Tiene secrecin vaginal con mal olor.  Siente dolor al orinar. SOLICITE ATENCIN MDICA DE INMEDIATO SI:   Tiene fiebre.  Tiene una prdida de lquido por la vagina.  Tiene sangrado o pequeas prdidas vaginales.  Siente dolor intenso o clicos en el abdomen.  Sube o baja de peso rpidamente.  Tiene dificultad para respirar y siente dolor de pecho.  Sbitamente se le hinchan mucho el rostro, las manos, los tobillos, los pies o las piernas.  No ha sentido los movimientos del beb durante una hora.  Siente un dolor de cabeza intenso que no se alivia con medicamentos.  Hay cambios en la visin. Document Released: 12/21/2004 Document Revised: 03/18/2013 Exit[employer] Patient Information 2015 [employer]. This information is not intended to replace advice given to you by your health care provider. Make sure you discuss any questions you have with your health care provider.  

## 2014-11-11 NOTE — Progress Notes (Signed)
Subjective:  Denise Ruiz is a 39 y.o. [phone] at [redacted]w[redacted]d being seen today for ongoing prenatal care.  Patient reports heartburn that responds with TUMS.  Contractions: Not present.  Vag. Bleeding: None. Movement: Present. Denies leaking of fluid.   The following portions of the patient's history were reviewed and updated as appropriate: allergies, current medications, past family history, past medical history, past social history, past surgical history and problem list.   Objective:   Filed Vitals:   11/11/14 1338  BP: 108/68  Pulse: 87  Weight: 150 lb 9.6 oz (68.312 kg)    Fetal Status: Fetal Heart Rate (bpm): 140   Movement: Present     General:  Alert, oriented and cooperative. Patient is in no acute distress.  Skin: Skin is warm and dry. No rash noted.   Cardiovascular: Normal heart rate noted  Respiratory: Normal respiratory effort, no problems with respiration noted  Abdomen: Soft, gravid, appropriate for gestational age. Pain/Pressure: Present     Pelvic: Vag. Bleeding: None     Cervical exam deferred        Extremities: Normal range of motion.  Edema: None  Mental Status: Normal mood and affect. Normal behavior. Normal judgment and thought content.   Urinalysis:      Assessment and Plan:  Pregnancy: [id number] at [redacted]w[redacted]d  1. Encounter for supervision of high risk multigravida of advanced maternal age, antepartum FHT and fundal height normal.  2. Heartburn in pregnancy, unspecified trimester TUMs  Preterm labor symptoms and general obstetric precautions including but not limited to vaginal bleeding, contractions, leaking of fluid and fetal movement were reviewed in detail with the patient. Please refer to After Visit Summary for other counseling recommendations.  No Follow-up on file.   Levie Heritage, DO

## 2014-11-11 NOTE — Progress Notes (Signed)
Reviewed breast feeding tip of the week with patient.  

## 2014-12-10 ENCOUNTER — Encounter: Payer: Self-pay | Admitting: Obstetrics & Gynecology

## 2014-12-11 ENCOUNTER — Ambulatory Visit (INDEPENDENT_AMBULATORY_CARE_PROVIDER_SITE_OTHER): Payer: Self-pay | Admitting: Obstetrics & Gynecology

## 2014-12-11 ENCOUNTER — Encounter: Payer: Self-pay | Admitting: Obstetrics & Gynecology

## 2014-12-11 VITALS — BP 93/61 | HR 83 | Temp 97.9°F | Wt 154.8 lb

## 2014-12-11 DIAGNOSIS — O09529 Supervision of elderly multigravida, unspecified trimester: Secondary | ICD-10-CM

## 2014-12-11 DIAGNOSIS — Z23 Encounter for immunization: Secondary | ICD-10-CM

## 2014-12-11 DIAGNOSIS — O09522 Supervision of elderly multigravida, second trimester: Secondary | ICD-10-CM

## 2014-12-11 LAB — POCT URINALYSIS DIP (DEVICE)
BILIRUBIN URINE: NEGATIVE
Glucose, UA: NEGATIVE mg/dL
HGB URINE DIPSTICK: NEGATIVE
KETONES UR: NEGATIVE mg/dL
Leukocytes, UA: NEGATIVE
Nitrite: NEGATIVE
PH: 7 (ref 5.0–8.0)
PROTEIN: NEGATIVE mg/dL
SPECIFIC GRAVITY, URINE: 1.015 (ref 1.005–1.030)
Urobilinogen, UA: 0.2 mg/dL (ref 0.0–1.0)

## 2014-12-11 LAB — GLUCOSE TOLERANCE, 1 HOUR (50G) W/O FASTING: Glucose, 1 Hour GTT: 121 mg/dL (ref 70–140)

## 2014-12-11 LAB — CBC
HEMATOCRIT: 31.5 % — AB (ref 36.0–46.0)
HEMOGLOBIN: 11 g/dL — AB (ref 12.0–15.0)
MCH: 30.6 pg (ref 26.0–34.0)
MCHC: 34.9 g/dL (ref 30.0–36.0)
MCV: 87.7 fL (ref 78.0–100.0)
MPV: 9.2 fL (ref 8.6–12.4)
Platelets: 212 10*3/uL (ref 150–400)
RBC: 3.59 MIL/uL — ABNORMAL LOW (ref 3.87–5.11)
RDW: 14.1 % (ref 11.5–15.5)
WBC: 8.2 10*3/uL (ref 4.0–10.5)

## 2014-12-11 MED ORDER — TETANUS-DIPHTH-ACELL PERTUSSIS 5-2.5-18.5 LF-MCG/0.5 IM SUSP
0.5000 mL | Freq: Once | INTRAMUSCULAR | Status: AC
  Administered 2014-12-11: 0.5 mL via INTRAMUSCULAR

## 2014-12-11 MED ORDER — FAMOTIDINE 40 MG PO TABS: 40.0000 mg | ORAL_TABLET | Freq: Every evening | ORAL | Status: DC

## 2014-12-11 NOTE — Progress Notes (Signed)
Interpreter Clent Ridges  28 wk packet given Tdap vaccine consented and info given

## 2014-12-11 NOTE — Patient Instructions (Signed)
Tercer trimestre del embarazo  (Third Trimester of Pregnancy)  El tercer trimestre del embarazo abarca desde la semana 29 hasta la semana 42, desde el 7 mes hasta el 9. En este trimestre el beb (feto) se desarrolla muy rpidamente. Hacia el final del noveno mes, el beb que an no ha nacido mide alrededor de 20 pulgadas (45 cm) de largo. Y pesa entre 6 y 10 libras (2,700 y 4,500 kg).  CUIDADOS EN EL HOGAR   Evite fumar, consumir hierbas y beber alcohol. Evite los frmacos que no apruebe el mdico.  Slo tome los medicamentos que le haya indicado su mdico. Algunos medicamentos son seguros para tomar durante el embarazo y otros no lo son.  Haga ejercicios slo como le indique el mdico. Deje de hacer ejercicios si comienza a tener clicos.  Haga comidas regulares y sanas.  Use un sostn que le brinde buen soporte si sus mamas estn sensibles.  No utilice la baera con agua caliente, baos turcos y saunas.  Colquese el cinturn de seguridad cuando conduzca.  Evite comer carne cruda y el contacto con los utensilios y desperdicios de los gatos.  Tome las vitaminas indicadas para la etapa prenatal.  Trate de tomar medicamentos para mover el intestino (laxantes) segn lo necesario y si su mdico la autoriza. Consuma ms fibra comiendo frutas y vegetales frescos y granos enteros. Beba gran cantidad de lquido para mantener el pis (orina) de tono claro o amarillo plido.  Tome baos de agua tibia (baos de asiento) para calmar el dolor o las molestias causadas por las hemorroides. Use una crema para las hemorroides si el mdico la autoriza.  Si tiene venas hinchadas y abultadas (venas varicosas), use medias de soporte. Eleve (levante) los pies durante 15 minutos, 3 o 4 veces por da. Limite el consumo de sal en su dieta.  Evite levantar objetos pesados, usar tacones altos y sintese derecha.  Descanse con las piernas elevadas si tiene calambres o dolor de cintura.  Visite a su dentista  si no lo ha hecho durante el embarazo. Use un cepillo de dientes blando para higienizarse los dientes. Use suavemente el hilo dental.  Puede tener sexo (relaciones sexuales) siempre que el mdico la autorice.  No viaje por largas distancias si puede evitarlo. Slo hgalo con la aprobacin de su mdico.  Haga el curso pre parto.  Practique conducir hasta el hospital.  Prepare el bolso que llevar.  Prepare la habitacin del beb.  Concurra a los controles mdicos. SOLICITE AYUDA SI:   No est segura si est en trabajo de parto o ha roto la bolsa de aguas.  Tiene mareos.  Siente clicos intensos o presin en la zona baja del vientre (abdomen).  Siente un dolor persistente en la zona del vientre.  Tiene malestar estomacal (nuseas), devuelve (vomita), o tiene deposiciones acuosas (diarrea).  Advierte un olor ftido que proviene de la vagina.  Siente dolor al hacer pis (orinar). SOLICITE AYUDA DE INMEDIATO SI:   Tiene fiebre.  Pierde lquido o sangre por la vagina.  Tiene sangrando o pequeas prdidas vaginales.  Siente dolor intenso o clicos en el abdomen.  Sube o baja de peso rpidamente.  Tiene dificultad para respirar o siente dolor en el pecho.  Sbitamente se le hinchan el rostro, las manos, los tobillos, los pies o las piernas.  No ha sentido los movimientos del beb durante una hora.  Siente un dolor de cabeza intenso que no se alivia con medicamentos.  Su visin se modifica. Document   Released: 11/13/2012 ExitCare Patient Information 2015 [employer]. This information is not intended to replace advice given to you by your health care provider. Make sure you discuss any questions you have with your health care provider.  

## 2014-12-11 NOTE — Progress Notes (Signed)
Still with reflux sx  Subjective:  Denise Ruiz is a 39 y.o. [phone] at [redacted]w[redacted]d being seen today for ongoing prenatal care.  Patient reports heartburn.  Contractions: Not present.  Vag. Bleeding: None. Movement: Present. Denies leaking of fluid.   The following portions of the patient's history were reviewed and updated as appropriate: allergies, current medications, past family history, past medical history, past social history, past surgical history and problem list.   Objective:   Filed Vitals:   12/11/14 0948  BP: 93/61  Pulse: 83  Temp: 97.9 F (36.6 C)  Weight: 154 lb 12.8 oz (70.217 kg)    Fetal Status:     Movement: Present     General:  Alert, oriented and cooperative. Patient is in no acute distress.  Skin: Skin is warm and dry. No rash noted.   Cardiovascular: Normal heart rate noted  Respiratory: Normal respiratory effort, no problems with respiration noted  Abdomen: Soft, gravid, appropriate for gestational age. Pain/Pressure: Present     Pelvic: Vag. Bleeding: None Vag D/C Character: White   Cervical exam deferred        Extremities: Normal range of motion.  Edema: None  Mental Status: Normal mood and affect. Normal behavior. Normal judgment and thought content.   Urinalysis: Urine Protein: Negative Urine Glucose: Negative  Assessment and Plan:  Pregnancy: [id number] at [redacted]w[redacted]d  1. Encounter for supervision of high risk multigravida of advanced maternal age, antepartum  - CBC - RPR - HIV antibody (with reflex) - Glucose Tolerance, 1 HR (50g)  2. Flu vaccine need today  Preterm labor symptoms and general obstetric precautions including but not limited to vaginal bleeding, contractions, leaking of fluid and fetal movement were reviewed in detail with the patient. Please refer to After Visit Summary for other counseling recommendations.  Return in about 2 weeks (around 12/25/2014).   Adam Phenix, MD

## 2014-12-12 LAB — HIV ANTIBODY (ROUTINE TESTING W REFLEX): HIV: NONREACTIVE

## 2014-12-12 LAB — RPR

## 2015-01-06 ENCOUNTER — Ambulatory Visit (INDEPENDENT_AMBULATORY_CARE_PROVIDER_SITE_OTHER): Payer: Self-pay | Admitting: Family

## 2015-01-06 VITALS — BP 101/71 | HR 87 | Temp 97.3°F | Wt 153.8 lb

## 2015-01-06 DIAGNOSIS — O2200 Varicose veins of lower extremity in pregnancy, unspecified trimester: Secondary | ICD-10-CM

## 2015-01-06 DIAGNOSIS — O09529 Supervision of elderly multigravida, unspecified trimester: Secondary | ICD-10-CM

## 2015-01-06 DIAGNOSIS — O2203 Varicose veins of lower extremity in pregnancy, third trimester: Secondary | ICD-10-CM

## 2015-01-06 LAB — POCT URINALYSIS DIP (DEVICE)
Bilirubin Urine: NEGATIVE
Glucose, UA: NEGATIVE mg/dL
HGB URINE DIPSTICK: NEGATIVE
Ketones, ur: NEGATIVE mg/dL
NITRITE: NEGATIVE
PH: 7.5 (ref 5.0–8.0)
PROTEIN: NEGATIVE mg/dL
SPECIFIC GRAVITY, URINE: 1.015 (ref 1.005–1.030)
UROBILINOGEN UA: 0.2 mg/dL (ref 0.0–1.0)

## 2015-01-06 NOTE — Progress Notes (Signed)
Spanish interpreter Delphina CahillSusan Ross   Pressure- vaginal pressure when she sits  Pain- back pain

## 2015-01-06 NOTE — Progress Notes (Signed)
Subjective:  Denise Ruiz is a 39 y.o. [phone] at 2610w4d being seen today for ongoing prenatal care.  Patient reports pelvic pressure when sitting and occasional back pain..  Contractions: Irritability.  Vag. Bleeding: None. Movement: Present. Denies leaking of fluid.   The following portions of the patient's history were reviewed and updated as appropriate: allergies, current medications, past family history, past medical history, past social history, past surgical history and problem list. Problem list updated.  Objective:   Filed Vitals:   01/06/15 1139  BP: 101/71  Pulse: 87  Temp: 97.3 F (36.3 C)  Weight: 153 lb 12.8 oz (69.763 kg)    Fetal Status: Fetal Heart Rate (bpm): 144 Fundal Height: 31 cm Movement: Present     General:  Alert, oriented and cooperative. Patient is in no acute distress.  Skin: Skin is warm and dry. No rash noted.   Cardiovascular: Normal heart rate noted  Respiratory: Normal respiratory effort, no problems with respiration noted  Abdomen: Soft, gravid, appropriate for gestational age. Pain/Pressure: Present     Pelvic: Vag. Bleeding: None Vag D/C Character: White   Cervical exam deferred        Extremities: Normal range of motion.  Edema: None  Mental Status: Normal mood and affect. Normal behavior. Normal judgment and thought content.   Urinalysis: Urine Protein: Negative Urine Glucose: Negative  Assessment and Plan:  Pregnancy: [id number] at 5910w4d  1. Encounter for supervision of high risk multigravida of advanced maternal age, antepartum - Reviewed 1 hr results - Tylenol for back  Preterm labor symptoms and general obstetric precautions including but not limited to vaginal bleeding, contractions, leaking of fluid and fetal movement were reviewed in detail with the patient. Please refer to After Visit Summary for other counseling recommendations.  Return in about 2 weeks (around 01/20/2015).   Eino FarberWalidah Kennith GainN Karim, CNM

## 2015-01-20 ENCOUNTER — Ambulatory Visit (INDEPENDENT_AMBULATORY_CARE_PROVIDER_SITE_OTHER): Payer: Self-pay | Admitting: Advanced Practice Midwife

## 2015-01-20 ENCOUNTER — Encounter: Payer: Self-pay | Admitting: Advanced Practice Midwife

## 2015-01-20 VITALS — BP 101/55 | HR 83 | Temp 98.6°F | Wt 158.3 lb

## 2015-01-20 DIAGNOSIS — K219 Gastro-esophageal reflux disease without esophagitis: Secondary | ICD-10-CM

## 2015-01-20 DIAGNOSIS — O09523 Supervision of elderly multigravida, third trimester: Secondary | ICD-10-CM

## 2015-01-20 DIAGNOSIS — Z23 Encounter for immunization: Secondary | ICD-10-CM

## 2015-01-20 DIAGNOSIS — O09529 Supervision of elderly multigravida, unspecified trimester: Secondary | ICD-10-CM | POA: Insufficient documentation

## 2015-01-20 DIAGNOSIS — O99613 Diseases of the digestive system complicating pregnancy, third trimester: Secondary | ICD-10-CM

## 2015-01-20 DIAGNOSIS — L259 Unspecified contact dermatitis, unspecified cause: Secondary | ICD-10-CM

## 2015-01-20 LAB — POCT URINALYSIS DIP (DEVICE)
BILIRUBIN URINE: NEGATIVE
Glucose, UA: NEGATIVE mg/dL
HGB URINE DIPSTICK: NEGATIVE
Ketones, ur: NEGATIVE mg/dL
Leukocytes, UA: NEGATIVE
NITRITE: NEGATIVE
PH: 7 (ref 5.0–8.0)
PROTEIN: NEGATIVE mg/dL
Specific Gravity, Urine: 1.015 (ref 1.005–1.030)
Urobilinogen, UA: 0.2 mg/dL (ref 0.0–1.0)

## 2015-01-20 MED ORDER — FAMOTIDINE 40 MG PO TABS: 40.0000 mg | ORAL_TABLET | Freq: Every day | ORAL | Status: DC

## 2015-01-20 MED ORDER — TRIAMCINOLONE ACETONIDE 0.1 % EX OINT: 1.0000 "application " | TOPICAL_OINTMENT | Freq: Two times a day (BID) | CUTANEOUS | Status: DC

## 2015-01-20 MED ORDER — FAMOTIDINE 40 MG PO TABS: 20.0000 mg | ORAL_TABLET | Freq: Every day | ORAL | Status: DC

## 2015-01-20 NOTE — Progress Notes (Signed)
Breastfeeding tip of the week reviewed Interpreter present

## 2015-01-20 NOTE — Patient Instructions (Signed)
Dermatitis de contacto °(Contact Dermatitis) °La dermatitis es el enrojecimiento, el dolor y la hinchazón (inflamación) de la piel. La dermatitis de contacto es una reacción a ciertas sustancias que entran en contacto con la piel. Hay dos tipos de dermatitis de contacto:  °· Dermatitis de contacto irritativa. La causa de este tipo de dermatitis es algo que irrita la piel, como las manos secas por lavarlas en exceso. Este tipo no requiere la exposición previa a la sustancia que causó la reacción. Este tipo es más frecuente. °· Dermatitis alérgica por contacto. La causa de este tipo de dermatitis es una sustancia a la cual se es alérgico, como una alergia al níquel o a la hiedra venenosa. Este tipo solo ocurre si ha estado expuesto anteriormente a la sustancia (alérgeno). Al repetir la exposición, el organismo reacciona a la sustancia. Este tipo es menos frecuente. °CAUSAS  °Muchas sustancias diferentes pueden causar dermatitis de contacto. La causa más frecuente de la dermatitis de contacto irritativa es la exposición a lo siguiente:  °· Maquillaje.   °· Jabones perfumados.   °· Detergentes.   °· Lavandina.   °· Ácidos.   °· Sales metálicas, como el níquel.   °Las causas de la dermatitis alérgica son las siguientes:  °· Plantas venenosas.   °· Productos químicos.   °· Alhajas.   °· Látex.   °· Medicamentos.   °· Conservantes que se utilizan en determinados productos, como la ropa.   °FACTORES DE RIESGO °Es más probable que esta afección se manifieste en:  °· Las personas que tienen trabajos que las exponen a irritantes o a alérgenos. °· Las personas que tienen determinadas enfermedades, por ejemplo, asma o eccema.   °SÍNTOMAS  °Los síntomas de esta afección pueden presentarse en cualquier parte del cuerpo con la que usted toque el irritante o donde la sustancia irritante lo haya tocado. Algunos síntomas son los siguientes: °· Sequedad o descamación.   °· Enrojecimiento.   °· Grietas.   °· Picazón.   °· Dolor o  sensación de ardor.   °· Ampollas. °· Secreción de pequeñas cantidades de sangre o de líquido transparente que emanan de las grietas de la piel. °En el caso de la dermatitis de contacto alérgica, puede haber hinchazón solo en algunas partes del cuerpo, como la boca o los genitales.  °DIAGNÓSTICO  °Esta afección se diagnostica mediante la historia clínica y un examen físico. Se puede realizar una prueba del parche para ayudar a determinar la causa. Si la afección guarda relación con el trabajo, tal vez deba consultar a un especialista en medicina ocupacional. °TRATAMIENTO °El tratamiento de esta afección incluye determinar la causa de la reacción y proteger la piel de nuevos contactos. El tratamiento también puede incluir lo siguiente:  °· Cremas o ungüentos con corticoides. En los casos más graves será necesario aplicar corticoides por vía oral. °· Ungüentos con antibióticos o antibacterianos, si hay una infección en la piel. °· Antihistamínicos en forma de loción o por vía oral para calmar la picazón. °· Un vendaje. °INSTRUCCIONES PARA EL CUIDADO EN EL HOGAR °Cuidado de la piel  °· Huméctese la piel según sea necesario.   °· Aplique compresas frías en las zonas afectadas. °· Trate de tomar un baño con lo siguiente: °¨ Sales de Epsom. Siga las instrucciones del envase. Puede conseguirlas en la tienda de comestibles o la farmacia local. °¨ Bicarbonato de sodio. Vierta un poco en la bañera como se lo haya indicado el médico. °¨ Avena coloidal. Siga las instrucciones del envase. Puede conseguirla en la tienda de comestibles o la farmacia local. °· Intente colocarse una pasta de bicarbonato de   sodio sobre la piel. Agregue agua al bicarbonato hasta que tenga la consistencia de una pasta. °· No se rasque la piel. °· Báñese con menos frecuencia, por ejemplo, cada dos días. °· Báñese con agua templada. No use agua caliente. °Medicamentos  °· Tome o aplíquese los medicamentos de venta libre y recetados solamente como se lo  haya indicado el médico.   °· Si le recetaron un antibiótico, tómelo o aplíqueselo como se lo haya indicado el médico. No deje de usar el antibiótico aunque la afección empiece a mejorar. °Instrucciones generales  °· Concurra a todas las visitas de control como se lo haya indicado el médico. Esto es importante. °· Evite la sustancia que ha causado la erupción. Si no sabe qué la causó, lleve un diario para tratar de identificar la causa. Escriba los siguientes datos: °¨ Lo que come. °¨ Los cosméticos que utiliza. °¨ Lo que bebe. °¨ Lo que llevó puesto en la zona afectada. Esto incluye las alhajas. °· Si le indicaron que use un vendaje, cuídelo como se lo haya indicado el médico. Esto incluye saber cuándo cambiarlo y cuándo quitárselo. °SOLICITE ATENCIÓN MÉDICA SI:  °· La afección no mejora con tratamiento. °· La afección empeora. °· Observa signos de infección, como hinchazón, sensibilidad, enrojecimiento, dolor o calor en la zona afectada. °· Tiene fiebre. °· Aparecen nuevos síntomas. °SOLICITE ATENCIÓN MÉDICA DE INMEDIATO SI:  °· Tiene dolor de cabeza intenso, dolor o rigidez en el cuello. °· Vomita. °· Se siente muy somnoliento. °· Nota una línea roja en la piel que sale de la zona afectada. °· El hueso o la articulación que se encuentran por debajo de la zona afectada le duelen después de que la piel se haya curado. °· La zona afectada se oscurece. °· Tiene dificultad para respirar. °  °Esta información no tiene como fin reemplazar el consejo del médico. Asegúrese de hacerle al médico cualquier pregunta que tenga. °  °Document Released: 12/21/2004 Document Revised: 12/02/2014 °Elsevier Interactive Patient Education ©2016 Else[employer] Inc. ° °

## 2015-01-21 NOTE — Progress Notes (Signed)
Subjective:  Denise Ruiz is a 39 y.o. [phone] at 5119w5d being seen today for ongoing prenatal care.  Patient reports vulvar itching, pelvic pressure. Denies urinary complaints, vaginal discharge, LOF or VB.  Contractions: Irritability.  Vag. Bleeding: None. Movement: Present. Denies leaking of fluid.   The following portions of the patient's history were reviewed and updated as appropriate: allergies, current medications, past family history, past medical history, past social history, past surgical history and problem list. Problem list updated.  Objective:   Filed Vitals:   01/20/15 1120  BP: 101/55  Pulse: 83  Temp: 98.6 F (37 C)  Weight: 158 lb 4.8 oz (71.804 kg)    Fetal Status: Fetal Heart Rate (bpm): 155   Movement: Present     General:  Alert, oriented and cooperative. Patient is in no acute distress.  Skin: Skin is warm and dry. No rash noted.   Cardiovascular: Normal heart rate noted  Respiratory: Normal respiratory effort, no problems with respiration noted  Abdomen: Soft, gravid, appropriate for gestational age. Pain/Pressure: Present     Pelvic: Vag. Bleeding: None Vag D/C Character: White   Cervical exam offered, declined         Vulva: No rash, discharge or lesions. Vulvar varicosities presents   Extremities: Normal range of motion.  Edema: None  Mental Status: Normal mood and affect. Normal behavior. Normal judgment and thought content.   Urinalysis: Urine Protein: Negative Urine Glucose: Negative  Assessment and Plan:  Pregnancy: [id number] at 6019w5d  1. AMA (advanced maternal age) multigravida 35+, third trimester   2. Encounter for supervision of high risk multigravida of advanced maternal age, antepartum    3. Flu vaccine need    4. Gastroesophageal reflux during pregnancy in third trimester, antepartum  - famotidine (PEPCID) 40 MG tablet; Take 1 tablet (40 mg total) by mouth daily.  Dispense: 30 tablet; Refill: 2  5. Contact dermatitis  -  triamcinolone ointment (KENALOG) 0.1 %; Apply 1 application topically 2 (two) times daily.  Dispense: 30 g; Refill: 0  Preterm labor symptoms and general obstetric precautions including but not limited to vaginal bleeding, contractions, leaking of fluid and fetal movement were reviewed in detail with the patient. Please refer to After Visit Summary for other counseling recommendations.  Maternity support belt, comfort measures. Return in about 2 weeks (around 02/03/2015).   Dorathy KinsmanVirginia Sahmya Arai, CNM

## 2015-02-03 ENCOUNTER — Ambulatory Visit (INDEPENDENT_AMBULATORY_CARE_PROVIDER_SITE_OTHER): Payer: Self-pay | Admitting: Student

## 2015-02-03 VITALS — BP 112/66 | HR 86 | Temp 98.1°F | Wt 157.4 lb

## 2015-02-03 DIAGNOSIS — O09529 Supervision of elderly multigravida, unspecified trimester: Secondary | ICD-10-CM

## 2015-02-03 DIAGNOSIS — O09523 Supervision of elderly multigravida, third trimester: Secondary | ICD-10-CM

## 2015-02-03 LAB — POCT URINALYSIS DIP (DEVICE)
Bilirubin Urine: NEGATIVE
Glucose, UA: NEGATIVE mg/dL
HGB URINE DIPSTICK: NEGATIVE
KETONES UR: NEGATIVE mg/dL
Leukocytes, UA: NEGATIVE
Nitrite: NEGATIVE
PH: 7.5 (ref 5.0–8.0)
PROTEIN: NEGATIVE mg/dL
SPECIFIC GRAVITY, URINE: 1.02 (ref 1.005–1.030)
Urobilinogen, UA: 0.2 mg/dL (ref 0.0–1.0)

## 2015-02-03 NOTE — Progress Notes (Signed)
Patient was at [employer] and the bag holder at check out hit her stomach.  Baby has been moving well since then.

## 2015-02-03 NOTE — Patient Instructions (Signed)
Evaluacin de los movimientos fetales  (Fetal Movement Counts) Nombre del paciente: __________________________________________________ Fecha de parto estimada: ____________________ La evaluacin de los movimientos fetales es muy recomendable en los embarazos de alto riesgo, pero tambin es una buena idea que lo hagan todas las embarazadas. El mdico le indicar que comience a contarlos a las 28 semanas de embarazo. Los movimientos fetales suelen aumentar:   Despus de una comida completa.  Despus de la actividad fsica.  Despus de comer o beber algo dulce o fro.  En reposo. Preste atencin cuando sienta que el beb est ms activo. Esto le ayudar a notar un patrn de ciclos de vigilia y sueo de su beb y cules son los factores que contribuyen a un aumento de los movimientos fetales. Es importante llevar a cabo un recuento de movimientos fetales, al mismo tiempo cada da, cuando el beb normalmente est ms activo.  CMO CONTAR LOS MOVIMIENTOS FETALES 1. Busque un lugar tranquilo y cmodo para sentarse o recostarse sobre el lado izquierdo. Al recostarse sobre su lado izquierdo, le proporciona una mejor circulacin de sangre y oxgeno al beb. 2. Anote el da y la hora en una hoja de papel o en un diario. 3. Comience contando las pataditas, revoloteos, chasquidos, vueltas o pinchazos en un perodo de 2 horas. Debe sentir al menos 10 movimientos en 2 horas. 4. Si no siente 10 movimientos en 2 horas, espere 2  3 horas y cuente de nuevo. Busque cambios en el patrn o si no cuenta lo suficiente en 2 horas. SOLICITE ATENCIN MDICA SI:   Siente menos de 10 pataditas en 2 horas, en dos intentos.  No hay movimientos durante una hora.  El patrn se modifica o le lleva ms tiempo cada da contar las 10 pataditas.  Siente que el beb no se mueve como lo hace habitualmente. Fecha: ____________ Movimientos: ____________ Hora de inicio: ____________ Hora de finalizacin: ____________  Fecha:  ____________ Movimientos: ____________ Hora de inicio: ____________ Hora de finalizacin: ____________  Fecha: ____________ Movimientos: ____________ Hora de inicio: ____________ Hora de finalizacin: ____________  Fecha: ____________ Movimientos: ____________ Hora de inicio: ____________ Hora de finalizacin: ____________  Fecha: ____________ Movimientos: ____________ Hora de inicio: ____________ Hora de finalizacin: ____________  Fecha: ____________ Movimientos: ____________ Hora de inicio: ____________ Hora de finalizacin: ____________  Fecha: ____________ Movimientos: ____________ Hora de inicio: ____________ Hora de finalizacin: ____________  Fecha: ____________ Movimientos: ____________ Hora de inicio: ____________ Hora de finalizacin: ____________  Fecha: ____________ Movimientos: ____________ Hora de inicio: ____________ Hora de finalizacin: ____________  Fecha: ____________ Movimientos: ____________ Hora de inicio: ____________ Hora de finalizacin: ____________  Fecha: ____________ Movimientos: ____________ Hora de inicio: ____________ Hora de finalizacin: ____________  Fecha: ____________ Movimientos: ____________ Hora de inicio: ____________ Hora de finalizacin: ____________  Fecha: ____________ Movimientos: ____________ Hora de inicio: ____________ Hora de finalizacin: ____________  Fecha: ____________ Movimientos: ____________ Hora de inicio: ____________ Hora de finalizacin: ____________  Fecha: ____________ Movimientos: ____________ Hora de inicio: ____________ Hora de finalizacin: ____________  Fecha: ____________ Movimientos: ____________ Hora de inicio: ____________ Hora de finalizacin: ____________  Fecha: ____________ Movimientos: ____________ Hora de inicio: ____________ Hora de finalizacin: ____________  Fecha: ____________ Movimientos: ____________ Hora de inicio: ____________ Hora de finalizacin: ____________  Fecha: ____________ Movimientos: ____________ Hora  de inicio: ____________ Hora de finalizacin: ____________  Fecha: ____________ Movimientos: ____________ Hora de inicio: ____________ Hora de finalizacin: ____________  Fecha: ____________ Movimientos: ____________ Hora de inicio: ____________ Hora de finalizacin: ____________  Fecha: ____________ Movimientos: ____________ Hora de inicio: ____________ Hora de   finalizacin: ____________  Franco NonesFecha: ____________ Movimientos: ____________ Stevan BornHora de inicio: ____________ Stevan BornHora de finalizacin: ____________  Franco NonesFecha: ____________ Movimientos: ____________ Stevan BornHora de inicio: ____________ Stevan BornHora de finalizacin: ____________  Franco NonesFecha: ____________ Movimientos: ____________ Stevan BornHora de inicio: ____________ Stevan BornHora de finalizacin: ____________  Franco NonesFecha: ____________ Movimientos: ____________ Stevan BornHora de inicio: ____________ Stevan BornHora de finalizacin: ____________  Franco NonesFecha: ____________ Movimientos: ____________ Stevan BornHora de inicio: ____________ Mammie RussianHora de finalizacin: ____________  Franco NonesFecha: ____________ Movimientos: ____________ Mammie RussianHora de inicio: ____________ Mammie RussianHora de finalizacin: ____________  Franco NonesFecha: ____________ Movimientos: ____________ Mammie RussianHora de inicio: ____________ Mammie RussianHora de finalizacin: ____________  Franco NonesFecha: ____________ Movimientos: ____________ Mammie RussianHora de inicio: ____________ Mammie RussianHora de finalizacin: ____________  Franco NonesFecha: ____________ Movimientos: ____________ Mammie RussianHora de inicio: ____________ Mammie RussianHora de finalizacin: ____________  Franco NonesFecha: ____________ Movimientos: ____________ Mammie RussianHora de inicio: ____________ Mammie RussianHora de finalizacin: ____________  Franco NonesFecha: ____________ Movimientos: ____________ Mammie RussianHora de inicio: ____________ Mammie RussianHora de finalizacin: ____________  Franco NonesFecha: ____________ Movimientos: ____________ Mammie RussianHora de inicio: ____________ Mammie RussianHora de finalizacin: ____________  Franco NonesFecha: ____________ Movimientos: ____________ Mammie RussianHora de inicio: ____________ Mammie RussianHora de finalizacin: ____________  Franco NonesFecha: ____________ Movimientos: ____________ Mammie RussianHora de inicio: ____________ Mammie RussianHora de finalizacin:  ____________  Franco NonesFecha: ____________ Movimientos: ____________ Mammie RussianHora de inicio: ____________ Mammie RussianHora de finalizacin: ____________  Franco NonesFecha: ____________ Movimientos: ____________ Mammie RussianHora de inicio: ____________ Mammie RussianHora de finalizacin: ____________  Franco NonesFecha: ____________ Movimientos: ____________ Mammie RussianHora de inicio: ____________ Mammie RussianHora de finalizacin: ____________  Franco NonesFecha: ____________ Movimientos: ____________ Mammie RussianHora de inicio: ____________ Mammie RussianHora de finalizacin: ____________  Franco NonesFecha: ____________ Movimientos: ____________ Mammie RussianHora de inicio: ____________ Mammie RussianHora de finalizacin: ____________  Franco NonesFecha: ____________ Movimientos: ____________ Mammie RussianHora de inicio: ____________ Mammie RussianHora de finalizacin: ____________  Franco NonesFecha: ____________ Movimientos: ____________ Mammie RussianHora de inicio: ____________ Mammie RussianHora de finalizacin: ____________  Franco NonesFecha: ____________ Movimientos: ____________ Mammie RussianHora de inicio: ____________ Mammie RussianHora de finalizacin: ____________  Franco NonesFecha: ____________ Movimientos: ____________ Mammie RussianHora de inicio: ____________ Mammie RussianHora de finalizacin: ____________  Franco NonesFecha: ____________ Movimientos: ____________ Mammie RussianHora de inicio: ____________ Mammie RussianHora de finalizacin: ____________  Franco NonesFecha: ____________ Movimientos: ____________ Mammie RussianHora de inicio: ____________ Mammie RussianHora de finalizacin: ____________  Franco NonesFecha: ____________ Movimientos: ____________ Mammie RussianHora de inicio: ____________ Mammie RussianHora de finalizacin: ____________  Franco NonesFecha: ____________ Movimientos: ____________ Mammie RussianHora de inicio: ____________ Mammie RussianHora de finalizacin: ____________  Franco NonesFecha: ____________ Movimientos: ____________ Mammie RussianHora de inicio: ____________ Mammie RussianHora de finalizacin: ____________  Franco NonesFecha: ____________ Movimientos: ____________ Mammie RussianHora de inicio: ____________ Mammie RussianHora de finalizacin: ____________  Franco NonesFecha: ____________ Movimientos: ____________ Mammie RussianHora de inicio: ____________ Mammie RussianHora de finalizacin: ____________  Franco NonesFecha: ____________ Movimientos: ____________ Mammie RussianHora de inicio: ____________ Mammie RussianHora de finalizacin: ____________  Franco NonesFecha: ____________  Movimientos: ____________ Mammie RussianHora de inicio: ____________ Mammie RussianHora de finalizacin: ____________  Franco NonesFecha: ____________ Movimientos: ____________ Mammie RussianHora de inicio: ____________ Mammie RussianHora de finalizacin: ____________  Franco NonesFecha: ____________ Movimientos: ____________ Mammie RussianHora de inicio: ____________ Mammie RussianHora de finalizacin: ____________    Esta informacin no tiene como fin reemplazar el consejo del mdico. Asegrese de hacerle al mdico cualquier pregunta que tenga.   Document Released: 06/20/2007 Document Revised: 02/28/2012 Elsevier Interactive Patient Education 2016 [employer] Informacin sobre el parto prematuro  (Preterm Labor Information)  Se llama parto prematuro cuando se inicia antes de las 37 semanas de [place]. La duracin de un embarazo normal es de 39 a 41 semanas.  CAUSAS  Generalmente las causas del parto prematuro no se conocen. La causa ms frecuente conocida es una infeccin.  FACTORES DE RIESGO   Historia previa de parto prematuro.  Romper la bolsa de aguas antes de [place].  La placenta cubre la abertura del cuello.  La placenta se despega del tero.  El cuello es demasiado dbil para contener al beb en el tero.  Hay mucho lquido en el saco amnitico.  Consumo de drogas  o hbito de Customer service manager.  No aumentar de peso lo suficiente durante el [employer].  Mujeres menores de 18 aos o mayores de [address].  Tener bajos ingresos.  Pertenecer a Engineer, production. SNTOMAS  5. Clicos similares a los menstruales, dolor en el vientre (abdominal) o dolor en la espalda. 6. Contracciones regulares, tan frecuentes como seis en una hora. Pueden ser suaves o dolorosas. 7. Contracciones que comienzan en la parte superior del vientre. Luego bajan hacia la zona inferior del vientre y [employer]. 8. Presin en la zona inferior del vientre que parece empeorar. 9. Sangrado que proviene de la vagina. 10. Prdida de lquido por la vagina. TRATAMIENTO  El tratamiento depende  de:   Su estado.  El [place] del beb.  Cuntas semanas tiene de [place]. El mdico podr indicarle:   Medicamentos para Print production planner.  Que permanezca en la cama excepto para ir al bao (reposo en cama).  Que permanezca en el hospital. QU DEBE HACER SI PIENSA QUE EST EN TRABAJO DE PARTO PREMATURO?  Comunquese con su mdico de inmediato. Debe concurrir al hospital para ser controlada inmediatamente.  CMO PUEDE EVITAR EL TRABAJO DE PARTO PREMATURO EN FUTUROS EMBARAZOS?   Si fuma, abandone el hbito.  Mantenga un aumento de peso saludable.  Notome drogas ni manipule sustancias qumicas que no necesita.  Informe a su mdico si piensa que tiene una infeccin.  Informe a su mdico si tuvo un trabajo de parto prematuro anteriormente.   Esta informacin no tiene Theme park manager el consejo del mdico. Asegrese de hacerle al mdico cualquier pregunta que tenga.   Document Released: 04/15/2010 Document Revised: 11/13/2012 Elsevier Interactive Patient Education [employer]

## 2015-02-03 NOTE — Progress Notes (Signed)
Subjective:  Denise Ruiz is a 39 y.o. [id number] at 7456w4d being seen today for ongoing prenatal care.  Patient reports no complaints.  Contractions: Irregular.  Vag. Bleeding: None. Movement: Present. Denies leaking of fluid.   The following portions of the patient's history were reviewed and updated as appropriate: allergies, current medications, past family history, past medical history, past social history, past surgical history and problem list. Problem list updated.  Objective:   Filed Vitals:   02/03/15 1145  BP: 112/66  Pulse: 86  Temp: 98.1 F (36.7 C)  Weight: 157 lb 6.4 oz (71.396 kg)    Fetal Status: Fetal Heart Rate (bpm): 140   Movement: Present     General:  Alert, oriented and cooperative. Patient is in no acute distress.  Skin: Skin is warm and dry. No rash noted.   Cardiovascular: Normal heart rate noted  Respiratory: Normal respiratory effort, no problems with respiration noted  Abdomen: Soft, gravid, appropriate for gestational age. Pain/Pressure: Present     Pelvic: Vag. Bleeding: None Vag D/C Character: Mucous   Cervical exam deferred        Extremities: Normal range of motion.  Edema: None  Mental Status: Normal mood and affect. Normal behavior. Normal judgment and thought content.   Urinalysis: Urine Protein: Negative Urine Glucose: Negative  Assessment and Plan:  Pregnancy: [id number] at 3756w4d  1. Encounter for supervision of high risk multigravida of advanced maternal age, antepartum   Preterm labor symptoms and general obstetric precautions including but not limited to vaginal bleeding, contractions, leaking of fluid and fetal movement were reviewed in detail with the patient. Please refer to After Visit Summary for other counseling recommendations.  Return in about 2 weeks (around 02/17/2015).   Judeth HornErin Kura Bethards, NP

## 2015-02-10 ENCOUNTER — Encounter: Payer: Self-pay | Admitting: Advanced Practice Midwife

## 2015-02-17 ENCOUNTER — Ambulatory Visit (INDEPENDENT_AMBULATORY_CARE_PROVIDER_SITE_OTHER): Payer: Self-pay | Admitting: Advanced Practice Midwife

## 2015-02-17 VITALS — BP 107/63 | HR 81 | Temp 97.6°F | Wt 161.1 lb

## 2015-02-17 DIAGNOSIS — O09523 Supervision of elderly multigravida, third trimester: Secondary | ICD-10-CM

## 2015-02-17 DIAGNOSIS — Z113 Encounter for screening for infections with a predominantly sexual mode of transmission: Secondary | ICD-10-CM

## 2015-02-17 LAB — OB RESULTS CONSOLE GBS: STREP GROUP B AG: POSITIVE

## 2015-02-17 LAB — POCT URINALYSIS DIP (DEVICE)
Bilirubin Urine: NEGATIVE
GLUCOSE, UA: NEGATIVE mg/dL
Hgb urine dipstick: NEGATIVE
Ketones, ur: NEGATIVE mg/dL
Leukocytes, UA: NEGATIVE
Nitrite: NEGATIVE
PH: 7.5 (ref 5.0–8.0)
PROTEIN: NEGATIVE mg/dL
Specific Gravity, Urine: 1.015 (ref 1.005–1.030)
UROBILINOGEN UA: 0.2 mg/dL (ref 0.0–1.0)

## 2015-02-17 LAB — OB RESULTS CONSOLE GC/CHLAMYDIA: GC PROBE AMP, GENITAL: NEGATIVE

## 2015-02-17 NOTE — Progress Notes (Signed)
Subjective:  Ann Makirgelia Peralta Cortes is a 39 y.o. [phone] at 5266w4d being seen today for ongoing prenatal care.  She is currently monitored for the following issues for this low-risk pregnancy and has Encounter for supervision of high risk multigravida of advanced maternal age, antepartum; Varicose veins during pregnancy, antepartum; and AMA (advanced maternal age) multigravida 35+ on her problem list.  Patient reports backache.  Contractions: Irregular. Vag. Bleeding: None.  Movement: (!) Decreased. Denies leaking of fluid.   The following portions of the patient's history were reviewed and updated as appropriate: allergies, current medications, past family history, past medical history, past social history, past surgical history and problem list. Problem list updated.  Objective:   Filed Vitals:   02/17/15 1121  BP: 107/63  Pulse: 81  Temp: 97.6 F (36.4 C)  Weight: 161 lb 1.6 oz (73.074 kg)    Fetal Status: Fetal Heart Rate (bpm): 136 Fundal Height: 38 cm Movement: (!) Decreased     General:  Alert, oriented and cooperative. Patient is in no acute distress.  Skin: Skin is warm and dry. No rash noted.   Cardiovascular: Normal heart rate noted  Respiratory: Normal respiratory effort, no problems with respiration noted  Abdomen: Soft, gravid, appropriate for gestational age. Pain/Pressure: Present     Pelvic: Vag. Bleeding: None Vag D/C Character: Watery   Cervical exam performed Dilation: 1 Effacement (%): 50 Station: -3  Extremities: Normal range of motion.  Edema: Trace  Mental Status: Normal mood and affect. Normal behavior. Normal judgment and thought content.   Urinalysis:      Assessment and Plan:  Pregnancy: [id number] at 2066w4d  There are no diagnoses linked to this encounter. Term labor symptoms and general obstetric precautions including but not limited to vaginal bleeding, contractions, leaking of fluid and fetal movement were reviewed in detail with the patient. Please  refer to After Visit Summary for other counseling recommendations.  No Follow-up on file.   Hurshel PartyLisa A Leftwich-Kirby, CNM

## 2015-02-17 NOTE — Addendum Note (Signed)
Addended by: Kathee DeltonHILLMAN, Gennie Dib L on: 02/17/2015 03:27 PM   Modules accepted: Orders

## 2015-02-17 NOTE — Addendum Note (Signed)
Addended by: Judd GaudierLEVENS, Kiwana Deblasi M on: 02/17/2015 12:43 PM   Modules accepted: Orders

## 2015-02-18 LAB — CULTURE, BETA STREP (GROUP B ONLY)

## 2015-02-19 LAB — GC/CHLAMYDIA PROBE AMP (~~LOC~~) NOT AT ARMC
CHLAMYDIA, DNA PROBE: NEGATIVE
Neisseria Gonorrhea: NEGATIVE

## 2015-02-22 ENCOUNTER — Telehealth: Payer: Self-pay | Admitting: *Deleted

## 2015-02-22 NOTE — Telephone Encounter (Signed)
Pt left message in Spanish which was translated by Pearletha AlfredMaria Elena Jimenez. Pt stated that she is [redacted] wks pregnant and thinks she lost her mucous plug over the past weekend. She wants to know if she needs to come to the hospital or just keep scheduled appt on 11/30. I called pt back with Marlynn PerkingMaria Elena. Pt denies regular UC's or fluid leaking from vagina.  Pt was advised that she does not need to be seen prior to her scheduled appt unless she has the sx of labor as previously mentioned or if the baby's movement becomes less.  Pt voiced understanding.

## 2015-02-24 ENCOUNTER — Ambulatory Visit (INDEPENDENT_AMBULATORY_CARE_PROVIDER_SITE_OTHER): Payer: Self-pay | Admitting: Advanced Practice Midwife

## 2015-02-24 VITALS — BP 93/52 | HR 71 | Temp 97.9°F | Wt 160.0 lb

## 2015-02-24 DIAGNOSIS — L259 Unspecified contact dermatitis, unspecified cause: Secondary | ICD-10-CM

## 2015-02-24 DIAGNOSIS — O09529 Supervision of elderly multigravida, unspecified trimester: Secondary | ICD-10-CM

## 2015-02-24 LAB — WET PREP, GENITAL
Trich, Wet Prep: NONE SEEN
Yeast Wet Prep HPF POC: NONE SEEN

## 2015-02-24 LAB — POCT URINALYSIS DIP (DEVICE)
Bilirubin Urine: NEGATIVE
GLUCOSE, UA: NEGATIVE mg/dL
KETONES UR: NEGATIVE mg/dL
Leukocytes, UA: NEGATIVE
NITRITE: NEGATIVE
PH: 7 (ref 5.0–8.0)
PROTEIN: NEGATIVE mg/dL
Specific Gravity, Urine: 1.015 (ref 1.005–1.030)
UROBILINOGEN UA: 0.2 mg/dL (ref 0.0–1.0)

## 2015-02-24 MED ORDER — TRIAMCINOLONE ACETONIDE 0.1 % EX OINT: 1.0000 "application " | TOPICAL_OINTMENT | Freq: Two times a day (BID) | CUTANEOUS | Status: DC

## 2015-02-24 NOTE — Addendum Note (Signed)
Addended by: Garret ReddishBARNES, Kellin Fifer M on: 02/24/2015 12:06 PM   Modules accepted: Orders

## 2015-02-24 NOTE — Patient Instructions (Signed)
Contracciones de Braxton Hicks °(Braxton Hicks Contractions) °Durante el embarazo, pueden presentarse contracciones uterinas que no siempre indican que está en trabajo de parto.  °¿QUÉ SON LAS CONTRACCIONES DE BRAXTON HICKS?  °Las contracciones que se presentan antes del trabajo de parto se conocen como contracciones de Braxton Hicks o falso trabajo de parto. Hacia el final del embarazo (32 a 34 semanas), estas contracciones pueden aparecen con más frecuencia y volverse más intensas. No corresponden al trabajo de parto verdadero porque estas contracciones no producen el agrandamiento (la dilatación) y el afinamiento del cuello del útero. Algunas veces, es difícil distinguirlas del trabajo de parto verdadero porque en algunos casos pueden ser muy intensas, y las personas tienen diferentes niveles de tolerancia al dolor. No debe sentirse avergonzada si concurre al hospital con falso trabajo de parto. En ocasiones, la única forma de saber si el trabajo de parto es verdadero es que el médico determine si hay cambios en el cuello del útero. °Si no hay problemas prenatales u otras complicaciones de salud asociadas con el embarazo, no habrá inconvenientes si la envían a su casa con falso trabajo de parto y espera que comience el verdadero. °CÓMO DIFERENCIAR EL TRABAJO DE PARTO FALSO DEL VERDADERO °Falso trabajo de parto °· Las contracciones del falso trabajo de parto duran menos y no son tan intensas como las verdaderas. °· Generalmente son irregulares. °· A menudo, se sienten en la parte delantera de la parte baja del abdomen y en la ingle, °· y pueden desaparecer cuando camina o cambia de posición mientras está acostada. °· Las contracciones se vuelven más débiles y su duración es menor a medida que el tiempo transcurre. °· Por lo general, no se hacen progresivamente más intensas, regulares y cercanas entre sí como en el caso del trabajo de parto verdadero. °Verdadero trabajo de parto °· Las contracciones del verdadero  trabajo de parto duran de 30 a 70 segundos, son muy regulares y suelen volverse más intensas, y aumenta su frecuencia. °· No desaparecen cuando camina. °· La molestia generalmente se siente en la parte superior del útero y se extiende hacia la zona inferior del abdomen y hacia la cintura. °· El médico podrá examinarla para determinar si el trabajo de parto es verdadero. El examen mostrará si el cuello del útero se está dilatando y afinando. °LO QUE DEBE RECORDAR °· Continúe haciendo los ejercicios habituales y siga otras indicaciones que el médico le dé. °· Tome todos los medicamentos como le indicó el médico. °· Concurra a las visitas prenatales regulares. °· Coma y beba con moderación si cree que está en trabajo de parto. °· Si las contracciones de Braxton Hicks le provocan incomodidad: °¨ Cambie de posición: si está acostada o descansando, camine; si está caminando, descanse. °¨ Siéntese y descanse en una bañera con agua tibia. °¨ Beba 2 o 3 vasos de agua. La deshidratación puede provocar contracciones. °¨ Respire lenta y profundamente varias veces por hora. °¿CUÁNDO DEBO BUSCAR ASISTENCIA MÉDICA INMEDIATA? °Solicite atención médica de inmediato si: °· Las contracciones se intensifican, se hacen más regulares y cercanas entre sí. °· Tiene una pérdida de líquido por la vagina. °· Tiene fiebre. °· Elimina mucosidad manchada con sangre. °· Tiene una hemorragia vaginal abundante. °· Tiene dolor abdominal permanente. °· Tiene un dolor en la zona lumbar que nunca tuvo antes. °· Siente que la cabeza del bebé empuja hacia abajo y ejerce presión en la zona pélvica. °· El bebé no se mueve tanto como solía. °  °Esta información no tiene como fin   reemplazar el consejo del médico. Asegúrese de hacerle al médico cualquier pregunta que tenga. °  °Document Released: 12/21/2004 Document Revised: 03/18/2013 °Elsevier Interactive Patient Education ©2016 Elsevier Inc. ° °

## 2015-02-24 NOTE — Progress Notes (Signed)
Used interpreter [employer] [place]. States lost triamincinolone cream , would like another prescription. C/o feeling anxious/ jumpy, weird.

## 2015-02-24 NOTE — Progress Notes (Signed)
Subjective:  Denise Ruiz is a 39 y.o. [phone] at 1753w4d being seen today for ongoing prenatal care.  She is currently monitored for the following issues for this high-risk pregnancy and has Encounter for supervision of high risk multigravida of advanced maternal age, antepartum; Varicose veins during pregnancy, antepartum; and AMA (advanced maternal age) multigravida 35+ on her problem list.  Patient reports losing mucus plug, continued vulvar itching. .  Contractions: Not present. Vag. Bleeding: None.  Movement: Present. Denies leaking of fluid.   The following portions of the patient's history were reviewed and updated as appropriate: allergies, current medications, past family history, past medical history, past social history, past surgical history and problem list. Problem list updated.  Objective:   Filed Vitals:   02/24/15 1027  BP: 93/52  Pulse: 71  Temp: 97.9 F (36.6 C)  Weight: 160 lb (72.576 kg)    Fetal Status: Fetal Heart Rate (bpm): 160   Movement: Present     General:  Alert, oriented and cooperative. Patient is in no acute distress.  Skin: Skin is warm and dry. No rash noted.   Cardiovascular: Normal heart rate noted  Respiratory: Normal respiratory effort, no problems with respiration noted  Abdomen: Soft, gravid, appropriate for gestational age. Pain/Pressure: Present     Pelvic: Vag. Bleeding: None Vag D/C Character: Mucous   Cervical exam performed      1/50/-3  Extremities: Normal range of motion.  Edema: None  Mental Status: Normal mood and affect. Normal behavior. Normal judgment and thought content.    BABY FEELS BREECH BY LEOPOLD'S, BUT IS VTX BY US.   Urinalysis: Urine Protein: Negative Urine Glucose: Negative  Assessment and Plan:  Pregnancy: [id number] at 2153w4d  1. Encounter for supervision of high risk multigravida of advanced maternal age, antepartum  Contact dermatitis  - Triamcinolone  - Stop using panty liners - Hypoallergenic soaps  and detergents   Term labor symptoms and general obstetric precautions including but not limited to vaginal bleeding, contractions, leaking of fluid and fetal movement were reviewed in detail with the patient. Please refer to After Visit Summary for other counseling recommendations.  Return in about 1 week (around 03/03/2015).   Dorathy KinsmanVirginia Alante Tolan, CNM

## 2015-03-03 ENCOUNTER — Ambulatory Visit (INDEPENDENT_AMBULATORY_CARE_PROVIDER_SITE_OTHER): Payer: Self-pay | Admitting: Advanced Practice Midwife

## 2015-03-03 ENCOUNTER — Encounter: Payer: Self-pay | Admitting: Advanced Practice Midwife

## 2015-03-03 VITALS — BP 100/60 | HR 86 | Temp 97.7°F | Wt 162.3 lb

## 2015-03-03 DIAGNOSIS — N760 Acute vaginitis: Secondary | ICD-10-CM | POA: Insufficient documentation

## 2015-03-03 DIAGNOSIS — B951 Streptococcus, group B, as the cause of diseases classified elsewhere: Secondary | ICD-10-CM

## 2015-03-03 DIAGNOSIS — A499 Bacterial infection, unspecified: Secondary | ICD-10-CM

## 2015-03-03 DIAGNOSIS — O09523 Supervision of elderly multigravida, third trimester: Secondary | ICD-10-CM

## 2015-03-03 DIAGNOSIS — O98819 Other maternal infectious and parasitic diseases complicating pregnancy, unspecified trimester: Secondary | ICD-10-CM

## 2015-03-03 DIAGNOSIS — O23593 Infection of other part of genital tract in pregnancy, third trimester: Secondary | ICD-10-CM

## 2015-03-03 DIAGNOSIS — B9689 Other specified bacterial agents as the cause of diseases classified elsewhere: Secondary | ICD-10-CM

## 2015-03-03 LAB — POCT URINALYSIS DIP (DEVICE)
BILIRUBIN URINE: NEGATIVE
Glucose, UA: NEGATIVE mg/dL
Hgb urine dipstick: NEGATIVE
Ketones, ur: NEGATIVE mg/dL
NITRITE: NEGATIVE
PH: 7 (ref 5.0–8.0)
Protein, ur: NEGATIVE mg/dL
Specific Gravity, Urine: 1.015 (ref 1.005–1.030)
Urobilinogen, UA: 0.2 mg/dL (ref 0.0–1.0)

## 2015-03-03 MED ORDER — METRONIDAZOLE 500 MG PO TABS: 500.0000 mg | ORAL_TABLET | Freq: Two times a day (BID) | ORAL | Status: DC

## 2015-03-03 NOTE — Patient Instructions (Signed)
Parto vaginal (Vaginal Delivery) Durante el parto, el mdico la ayudar a dar a luz a su beb. En elparto vaginal, deber pujar para que el beb salga por la vagina. Sin embargo, antes de que pueda sacar al beb, es necesario que ocurran ciertas cosas. La abertura del tero (cuello del tero) tiene que ablandarse, hacerse ms delgado y abrirse (dilatar) hasta que llegue a 10 cm. Adems, el beb tiene que bajar desde el tero a la vagina. SIGNOS DE TRABAJO DE PARTO  El mdico tendr primero que asegurarse de que usted est en trabajo de parto. Algunos signos son:   Eliminar lo que se llama tapn mucoso antes del inicio del trabajo de parto. Este es una pequea cantidad de mucosidad teida con sangre.  Tener contracciones uterinas regulares y dolorosas.   El tiempo entre las contracciones debe acortarse  Las molestias y el dolor se harn ms intensos gradualmente.  El dolor de las contracciones empeora al caminar y no se alivia con el reposo.   El cuello del tero se hace mas delgado (se borra) y se dilata. ANTES DEL PARTO Una vez que se inicie el trabajo de parto y sea admitida en el hospital o sanatorio, el mdico podr hacer lo siguiente:   Realizar un examen fsico.  Controlar si hay complicaciones relacionadas con el trabajo de parto.  Verificar su presin arterial, temperatura y pulso y la frecuencia cardaca (signos vitales).   Determinar si se ha roto el saco amnitico y cundo ha ocurrido.  Realizar un examen vaginal (utilizando un guante estril y un lubricante) para determinar:  La posicin (presentacin) del beb. El beb se presenta con la cabeza primero (vertex) en el canal de parto (vagina), o estn los pies o las nalgas primero (de nalgas)?  El nivel (estacin) de la cabeza del beb dentro del canal de parto.  El borramiento y la dilatacin del cuello uterino  El monitor fetal electrnico generalmente se coloca sobre el abdomen al llegar. Se utiliza para  controlar las contracciones y la frecuencia cardaca del beb.  Cuando el monitor est en el abdomen (monitor fetal externo), slo toma la frecuencia y la duracin de las contracciones. No informa acerca de la intensidad de las contracciones.  Si el mdico necesita saber exactamente la intensidad de las contracciones o cul es la frecuencia cardaca del beb, colocar un monitor interno en la vagina y el tero. El mdico comentar los riesgos y los beneficios de usar un monitor interno y le pedir autorizacin antes de colocar el dispositivo.  El monitoreo fetal continuo ser necesario si le han aplicado una epidural, si le administran ciertos medicamentos (como oxitocina) y si tiene complicaciones del embarazo o del trabajo de parto.  Podrn colocarle una va intravenosa en una vena del brazo para suministrarle lquidos y medicamentos, si es necesario. TRES ETAPAS DEL TRABAJO DE PARTO Y EL PARTO El trabajo de parto y el parto normales se dividen en tres etapas. Primera etapa Esta etapa comienza cuando comienzan las contracciones regulares y el cuello comienza a borrarse y dilatarse. Finaliza cuando el cuello est completamente abierto (completamente dilatado). La primera etapa es la etapa ms larga del trabajo de parto y puede durar desde 3 horas a 15 horas.  Algunos mtodos estn disponibles para ayudar con el dolor del parto. Usted y su mdico decidirn qu opcin es la mejor para usted. Las opciones incluyen:   Medicamentos narcticos. Estos son medicamentos fuertes que usted puede recibir a travs de una va intravenosa o   como inyeccin en el msculo. Estos medicamentos alivian el dolor pero no hacen que desaparezca completamente.  Epidural. Se administra un medicamento a travs de un tubo delgado que se inserta en la espalda. El medicamento adormece la parte inferior del cuerpo y evita el dolor en esa zona.  Bloqueo paracervical Es una inyeccin de un anestsico en cada lado del cuello  uterino.  Usted podr pedir un parto natural, que implica que no se usen analgsicos ni epidural durante el parto y el trabajo de parto. En cambio, podr tener otro tipo de ayuda como ejercicios respiratorios para hacer frente al dolor. Segunda etapa La segunda etapa del trabajo de parto comienza cuando el cuello se ha dilatado completamente a 10 cm. Contina hasta que usted puja al beb hacia abajo, por el canal de parto, y el beb nace. Esta etapa puede durar slo algunos minutos o algunas horas.  La posicin del la cabeza del beb a medida que pasa por el canal de parto, es informada como un nmero, llamado estacin. Si la cabeza del beb no ha iniciado su descenso, la estacin se describe como que est en menos 3 (-3). Cuando la cabeza del beb est en la estacin cero, est en el medio del canal de parto y se encaja en la pelvis. La estacin en la que se encuentra el beb indica el progreso de la segunda etapa del trabajo de parto.  Cuando el beb nace, el mdico lo sostendr con la cabeza hacia abajo para evitar que el lquido amnitico, el moco y la sangre entren en los pulmones del beb. La boca y la nariz del beb podrn ser succionadas con un pequeo bulbo para retirar todo lquido adicional.  El mdico podr colocar al beb sobre su estmago. Es importante evitar que el beb tome fro. Para hacerlo, el mdico secar al beb, lo colocar directamente sobre su piel, (sin mantas entre usted y el beb) y lo cubrir con mantas secas y tibias.  Se corta el cordn umbilical. Tercera etapa Durante la tercera etapa del trabajo de parto, el mdico sacar la placenta (alumbramiento) y se asegurar de que el sangrado est controlado. La salida de la placenta generalmente demora 5 minutos pero puede tardar hasta 30 minutos. Luego de la salida de la placenta, le darn un medicamento por va intravenosa o inyectable para ayudar a contraer el tero y controlar el sangrado. Si planea amamantar al beb,  puede intentar en este momento Luego de la salida de la placenta, el tero debe contraerse y quedar muy firme. Si el tero no queda firme, el mdico lo masajear. Esto es importante debido a que la contraccin del tero ayuda a cortar el sangrado en el sitio en que la placenta estaba unida al tero. Si el tero no se contrae adecuadamente ni permanece firme, podr causar un sangrado abundante. Si hay mucho sangrado, podrn darle medicamentos para contraer el tero y detener el sangrado.    Esta informacin no tiene como fin reemplazar el consejo del mdico. Asegrese de hacerle al mdico cualquier pregunta que tenga.   Document Released: 02/24/2008 Document Revised: 04/03/2014 Elsevier Interactive Patient Education 2016 [employer]  

## 2015-03-03 NOTE — Progress Notes (Signed)
Subjective:  Ann Makirgelia Peralta Cortes is a 39 y.o. [phone] at 6060w4d being seen today for ongoing prenatal care.  She is currently monitored for the following issues for this low-risk pregnancy and has Encounter for supervision of high risk multigravida of advanced maternal age, antepartum; Varicose veins during pregnancy, antepartum; AMA (advanced maternal age) multigravida 35+; BV (bacterial vaginosis); and Group B streptococcal infection during pregnancy on her problem list.  Patient reports vaginal irritation.  Contractions: Not present. Vag. Bleeding: None.  Movement: Present. Denies leaking of fluid.   The following portions of the patient's history were reviewed and updated as appropriate: allergies, current medications, past family history, past medical history, past social history, past surgical history and problem list. Problem list updated.  Objective:   Filed Vitals:   03/03/15 1128  BP: 100/60  Pulse: 86  Temp: 97.7 F (36.5 C)  Weight: 73.619 kg (162 lb 4.8 oz)    Fetal Status: Fetal Heart Rate (bpm): 154   Movement: Present     General:  Alert, oriented and cooperative. Patient is in no acute distress.  Skin: Skin is warm and dry. No rash noted.   Cardiovascular: Normal heart rate noted  Respiratory: Normal respiratory effort, no problems with respiration noted  Abdomen: Soft, gravid, appropriate for gestational age. Pain/Pressure: Present     Pelvic: Vag. Bleeding: None Vag D/C Character: White   Cervical exam deferred        Extremities: Normal range of motion.  Edema: None  Mental Status: Normal mood and affect. Normal behavior. Normal judgment and thought content.   Urinalysis:      Assessment and Plan:  Pregnancy: [id number] at 2760w4d  1. BV (bacterial vaginosis)     Rx Flagyl x 7d  2. Group B streptococcal infection during pregnancy     Discussed. Will treat in labor  Term labor symptoms and general obstetric precautions including but not limited to vaginal  bleeding, contractions, leaking of fluid and fetal movement were reviewed in detail with the patient. Please refer to After Visit Summary for other counseling recommendations.  Return in about 1 week (around 03/10/2015) for [facility].   Aviva SignsMarie L Mayleen Borrero, CNM

## 2015-03-03 NOTE — Progress Notes (Signed)
Pt reports white/yellow vaginal discharge w/itching and some burning.

## 2015-03-10 ENCOUNTER — Ambulatory Visit (INDEPENDENT_AMBULATORY_CARE_PROVIDER_SITE_OTHER): Payer: Self-pay | Admitting: Family

## 2015-03-10 VITALS — BP 104/63 | HR 87 | Temp 98.8°F | Wt 164.8 lb

## 2015-03-10 DIAGNOSIS — O09523 Supervision of elderly multigravida, third trimester: Secondary | ICD-10-CM

## 2015-03-10 LAB — POCT URINALYSIS DIP (DEVICE)
BILIRUBIN URINE: NEGATIVE
Glucose, UA: NEGATIVE mg/dL
HGB URINE DIPSTICK: NEGATIVE
KETONES UR: NEGATIVE mg/dL
LEUKOCYTES UA: NEGATIVE
Nitrite: NEGATIVE
PH: 7 (ref 5.0–8.0)
Protein, ur: NEGATIVE mg/dL
SPECIFIC GRAVITY, URINE: 1.01 (ref 1.005–1.030)
Urobilinogen, UA: 0.2 mg/dL (ref 0.0–1.0)

## 2015-03-10 NOTE — Progress Notes (Signed)
Subjective:  Denise Ruiz is a 39 y.o. [phone] at 2447w4d being seen today for ongoing prenatal care.  She is currently monitored for the following issues for this high-risk pregnancy and has Encounter for supervision of high risk multigravida of advanced maternal age, antepartum; Varicose veins during pregnancy, antepartum; AMA (advanced maternal age) multigravida 35+; BV (bacterial vaginosis); and Group B streptococcal infection during pregnancy on her problem list.  Patient reports no complaints.  Contractions: Not present. Vag. Bleeding: None.  Movement: Present. Denies leaking of fluid.   The following portions of the patient's history were reviewed and updated as appropriate: allergies, current medications, past family history, past medical history, past social history, past surgical history and problem list. Problem list updated.  Objective:   Filed Vitals:   03/10/15 1139  BP: 104/63  Pulse: 87  Temp: 98.8 F (37.1 C)  Weight: 164 lb 12.8 oz (74.753 kg)    Fetal Status: Fetal Heart Rate (bpm): 130 Fundal Height: 41 cm Movement: Present  Presentation: Vertex  General:  Alert, oriented and cooperative. Patient is in no acute distress.  Skin: Skin is warm and dry. No rash noted.   Cardiovascular: Normal heart rate noted  Respiratory: Normal respiratory effort, no problems with respiration noted  Abdomen: Soft, gravid, appropriate for gestational age. Pain/Pressure: Present     Pelvic: Vag. Bleeding: None     Cervical exam performed      2/50/-3  Extremities: Normal range of motion.  Edema: Trace  Mental Status: Normal mood and affect. Normal behavior. Normal judgment and thought content.   Urinalysis: Urine Protein: Negative Urine Glucose: Negative  Assessment and Plan:  Pregnancy: [id number] at 7347w4d  1. AMA (advanced maternal age) multigravida 35+, third trimester - Begin fetal testing next week with IOL at 41 wks  Term labor symptoms and general obstetric  precautions including but not limited to vaginal bleeding, contractions, leaking of fluid and fetal movement were reviewed in detail with the patient. Please refer to After Visit Summary for other counseling recommendations.  No Follow-up on file.   Eino FarberWalidah Kennith GainN Karim, CNM

## 2015-03-10 NOTE — Progress Notes (Signed)
Spanish interpreter Raquel Mora 

## 2015-03-12 ENCOUNTER — Telehealth: Payer: Self-pay | Admitting: *Deleted

## 2015-03-12 NOTE — Telephone Encounter (Signed)
Received a voice message in Spanish . Called number left with [employer] [phone]. Patient c/o brownish fluid from vagina, denies contractions or pain. States it is sticky, denies any other liquid like water . Also c/o baby moving less today than usual. Advised patient to come to [facility].

## 2015-03-13 ENCOUNTER — Encounter (HOSPITAL_COMMUNITY): Payer: Self-pay | Admitting: *Deleted

## 2015-03-13 ENCOUNTER — Inpatient Hospital Stay (HOSPITAL_COMMUNITY)
Admission: AD | Admit: 2015-03-13 | Discharge: 2015-03-13 | Disposition: A | Discharge status: Home / Self Care | Payer: Self-pay | Source: Ambulatory Visit | Attending: Obstetrics & Gynecology | Admitting: Obstetrics & Gynecology

## 2015-03-13 DIAGNOSIS — N760 Acute vaginitis: Secondary | ICD-10-CM

## 2015-03-13 DIAGNOSIS — Z3493 Encounter for supervision of normal pregnancy, unspecified, third trimester: Secondary | ICD-10-CM | POA: Insufficient documentation

## 2015-03-13 DIAGNOSIS — B951 Streptococcus, group B, as the cause of diseases classified elsewhere: Secondary | ICD-10-CM

## 2015-03-13 DIAGNOSIS — O2200 Varicose veins of lower extremity in pregnancy, unspecified trimester: Secondary | ICD-10-CM

## 2015-03-13 DIAGNOSIS — O98819 Other maternal infectious and parasitic diseases complicating pregnancy, unspecified trimester: Secondary | ICD-10-CM

## 2015-03-13 DIAGNOSIS — O09523 Supervision of elderly multigravida, third trimester: Secondary | ICD-10-CM

## 2015-03-13 DIAGNOSIS — B9689 Other specified bacterial agents as the cause of diseases classified elsewhere: Secondary | ICD-10-CM

## 2015-03-13 HISTORY — DX: Gastro-esophageal reflux disease without esophagitis: K21.9

## 2015-03-13 LAB — URINALYSIS, ROUTINE W REFLEX MICROSCOPIC
BILIRUBIN URINE: NEGATIVE
Glucose, UA: NEGATIVE mg/dL
HGB URINE DIPSTICK: NEGATIVE
Ketones, ur: NEGATIVE mg/dL
Leukocytes, UA: NEGATIVE
NITRITE: NEGATIVE
PROTEIN: NEGATIVE mg/dL
Specific Gravity, Urine: <1.005 — ABNORMAL LOW (ref 1.005–1.030)
pH: 6 (ref 5.0–8.0)

## 2015-03-13 LAB — AMNISURE RUPTURE OF MEMBRANE (ROM) NOT AT ARMC: AMNISURE: NEGATIVE

## 2015-03-13 LAB — POCT FERN TEST

## 2015-03-13 NOTE — [facility] Note (Signed)
C/o ? SROM on 03/11/15; states that the leaking was not a large amount but was sticky in texture;

## 2015-03-13 NOTE — Discharge Instructions (Signed)
Fetal Movement Counts °Patient Name: __________________________________________________ Patient Due Date: ____________________ °Performing a fetal movement count is highly recommended in high-risk pregnancies, but it is good for every pregnant woman to do. Your health care provider may ask you to start counting fetal movements at 28 weeks of the pregnancy. Fetal movements often increase: °· After eating a full meal. °· After physical activity. °· After eating or drinking something sweet or cold. °· At rest. °Pay attention to when you feel the baby is most active. This will help you notice a pattern of your baby's sleep and wake cycles and what factors contribute to an increase in fetal movement. It is important to perform a fetal movement count at the same time each day when your baby is normally most active.  °HOW TO COUNT FETAL MOVEMENTS °1. Find a quiet and comfortable area to sit or lie down on your left side. Lying on your left side provides the best blood and oxygen circulation to your baby. °2. Write down the day and time on a sheet of paper or in a journal. °3. Start counting kicks, flutters, swishes, rolls, or jabs in a 2-hour period. You should feel at least 10 movements within 2 hours. °4. If you do not feel 10 movements in 2 hours, wait 2-3 hours and count again. Look for a change in the pattern or not enough counts in 2 hours. °SEEK MEDICAL CARE IF: °· You feel less than 10 counts in 2 hours, tried twice. °· There is no movement in over an hour. °· The pattern is changing or taking longer each day to reach 10 counts in 2 hours. °· You feel the baby is not moving as he or she usually does. °Date: ____________ Movements: ____________ Start time: ____________ Finish time: ____________  °Date: ____________ Movements: ____________ Start time: ____________ Finish time: ____________ °Date: ____________ Movements: ____________ Start time: ____________ Finish time: ____________ °Date: ____________ Movements:  ____________ Start time: ____________ Finish time: ____________ °Date: ____________ Movements: ____________ Start time: ____________ Finish time: ____________ °Date: ____________ Movements: ____________ Start time: ____________ Finish time: ____________ °Date: ____________ Movements: ____________ Start time: ____________ Finish time: ____________ °Date: ____________ Movements: ____________ Start time: ____________ Finish time: ____________  °Date: ____________ Movements: ____________ Start time: ____________ Finish time: ____________ °Date: ____________ Movements: ____________ Start time: ____________ Finish time: ____________ °Date: ____________ Movements: ____________ Start time: ____________ Finish time: ____________ °Date: ____________ Movements: ____________ Start time: ____________ Finish time: ____________ °Date: ____________ Movements: ____________ Start time: ____________ Finish time: ____________ °Date: ____________ Movements: ____________ Start time: ____________ Finish time: ____________ °Date: ____________ Movements: ____________ Start time: ____________ Finish time: ____________  °Date: ____________ Movements: ____________ Start time: ____________ Finish time: ____________ °Date: ____________ Movements: ____________ Start time: ____________ Finish time: ____________ °Date: ____________ Movements: ____________ Start time: ____________ Finish time: ____________ °Date: ____________ Movements: ____________ Start time: ____________ Finish time: ____________ °Date: ____________ Movements: ____________ Start time: ____________ Finish time: ____________ °Date: ____________ Movements: ____________ Start time: ____________ Finish time: ____________ °Date: ____________ Movements: ____________ Start time: ____________ Finish time: ____________  °Date: ____________ Movements: ____________ Start time: ____________ Finish time: ____________ °Date: ____________ Movements: ____________ Start time: ____________ Finish  time: ____________ °Date: ____________ Movements: ____________ Start time: ____________ Finish time: ____________ °Date: ____________ Movements: ____________ Start time: ____________ Finish time: ____________ °Date: ____________ Movements: ____________ Start time: ____________ Finish time: ____________ °Date: ____________ Movements: ____________ Start time: ____________ Finish time: ____________ °Date: ____________ Movements: ____________ Start time: ____________ Finish time: ____________  °Date: ____________ Movements: ____________ Start time: ____________ Finish   time: ____________ Date: ____________ Movements: ____________ Start time: ____________ Doreatha MartinFinish time: ____________ Date: ____________ Movements: ____________ Start time: ____________ Doreatha MartinFinish time: ____________ Date: ____________ Movements: ____________ Start time: ____________ Doreatha MartinFinish time: ____________ Date: ____________ Movements: ____________ Start time: ____________ Doreatha MartinFinish time: ____________ Date: ____________ Movements: ____________ Start time: ____________ Doreatha MartinFinish time: ____________ Date: ____________ Movements: ____________ Start time: ____________ Doreatha MartinFinish time: ____________  Date: ____________ Movements: ____________ Start time: ____________ Doreatha MartinFinish time: ____________ Date: ____________ Movements: ____________ Start time: ____________ Doreatha MartinFinish time: ____________ Date: ____________ Movements: ____________ Start time: ____________ Doreatha MartinFinish time: ____________ Date: ____________ Movements: ____________ Start time: ____________ Doreatha MartinFinish time: ____________ Date: ____________ Movements: ____________ Start time: ____________ Doreatha MartinFinish time: ____________ Date: ____________ Movements: ____________ Start time: ____________ Doreatha MartinFinish time: ____________ Date: ____________ Movements: ____________ Start time: ____________ Doreatha MartinFinish time: ____________  Date: ____________ Movements: ____________ Start time: ____________ Doreatha MartinFinish time: ____________ Date: ____________  Movements: ____________ Start time: ____________ Doreatha MartinFinish time: ____________ Date: ____________ Movements: ____________ Start time: ____________ Doreatha MartinFinish time: ____________ Date: ____________ Movements: ____________ Start time: ____________ Doreatha MartinFinish time: ____________ Date: ____________ Movements: ____________ Start time: ____________ Doreatha MartinFinish time: ____________ Date: ____________ Movements: ____________ Start time: ____________ Doreatha MartinFinish time: ____________ Date: ____________ Movements: ____________ Start time: ____________ Doreatha MartinFinish time: ____________  Date: ____________ Movements: ____________ Start time: ____________ Doreatha MartinFinish time: ____________ Date: ____________ Movements: ____________ Start time: ____________ Doreatha MartinFinish time: ____________ Date: ____________ Movements: ____________ Start time: ____________ Doreatha MartinFinish time: ____________ Date: ____________ Movements: ____________ Start time: ____________ Doreatha MartinFinish time: ____________ Date: ____________ Movements: ____________ Start time: ____________ Doreatha MartinFinish time: ____________ Date: ____________ Movements: ____________ Start time: ____________ Doreatha MartinFinish time: ____________   This information is not intended to replace advice given to you by your health care provider. Make sure you discuss any questions you have with your health care provider.   Document Released: 04/12/2006 Document Revised: 04/03/2014 Document Reviewed: 01/08/2012 Elsevier Interactive Patient Education 2016 [employer] Systems analystTercer trimestre de Psychiatristembarazo (Third Trimester of Pregnancy) El tercer trimestre comprende desde la semana29 hasta la semana42, es decir, desde el mes7 hasta el [address]. En este trimestre, el feto crece muy rpido. Hacia el final del noveno mes, el feto mide alrededor de 20pulgadas (45cm) de largo y pesa entre 6y 10libras [phone](2,700y 4,500kg).  CUIDADOS EN EL HOGAR   No fume, no consuma hierbas ni beba alcohol. No tome frmacos que el mdico no haya autorizado.  No consuma ningn  producto que contenga tabaco, lo que incluye cigarrillos, tabaco de Theatre managermascar o Administrator, Civil Servicecigarrillos electrnicos. Si necesita ayuda para dejar de fumar, consulte al American Expressmdico. Puede recibir asesoramiento u otro tipo de apoyo para dejar de fumar.  Tome los medicamentos solamente como se lo haya indicado el mdico. Algunos medicamentos son seguros para tomar durante el Psychiatristembarazo y otros no lo son.  Haga ejercicios solamente como se lo haya indicado el mdico. Interrumpa la actividad fsica si comienza a tener calambres.  Ingiera alimentos saludables de [place]manera regular.  Use un sostn que le brinde buen soporte si sus mamas estn sensibles.  No se d baos de inmersin en agua caliente, baos turcos ni saunas.  Colquese el cinturn de seguridad cuando conduzca.  No coma carne cruda ni queso sin cocinar; evite el contacto con las bandejas sanitarias de los gatos y la tierra que estos animales usan.  Tome las vitaminas prenatales.  Tome entre 1500 y 2000mg  de calcio diariamente comenzando en la semana20 del embarazo [place] el parto.  Pruebe tomar un medicamento que la ayude a defecar (un laxante suave) si el mdico lo autoriza. Consuma ms fibra, que se Smurfit-Stone Containerencuentra en las  frutas y verduras frescas y los cereales integrales. Beba suficiente lquido para mantener el pis (orina) claro o de color amarillo plido.  Dese baos de asiento con agua tibia para Engineer, materials o las molestias causadas por las hemorroides. Use una crema para las hemorroides si el mdico la autoriza.  Si se le hinchan las venas (venas varicosas), use medias de descanso. Levante (eleve) los pies durante , 3 o 4veces por Futures trader. Limite el consumo de sal en su dieta.  No levante objetos pesados, use zapatos de tacones bajos y sintese derecha.  Descanse con las piernas elevadas si tiene calambres o dolor de cintura.  Visite a su dentista si no lo ha Occupational hygienist. Use un cepillo de cerdas suaves para cepillarse los  dientes. Psese el hilo dental con suavidad.  Puede seguir [employer], a menos que el mdico le indique lo contrario.  No haga viajes de larga distancia, excepto si es obligatorio y solamente con la aprobacin del mdico.  Tome clases prenatales.  Practique ir manejando al hospital.  Prepare el bolso que llevar al hospital.  Prepare la habitacin del beb.  Concurra a los controles mdicos. SOLICITE AYUDA SI: 5. No est segura de si est en trabajo de parto o si ha roto la bolsa de las aguas. 6. Tiene mareos. 7. Siente calambres leves o presin en la parte inferior del abdomen. 8. Sufre un dolor persistente en el abdomen. 9. Tiene malestar estomacal (nuseas), vmitos, o tiene deposiciones acuosas (diarrea). 10. Advierte un olor ftido que proviene de la vagina. 11. Siente dolor al [employer]. SOLICITE AYUDA DE INMEDIATO SI:   Tiene fiebre.  Tiene una prdida de lquido por la vagina.  Tiene sangrado o pequeas prdidas vaginales.  Siente dolor intenso o clicos en el abdomen.  Sube o baja de peso rpidamente.  Tiene dificultades para recuperar el aliento y siente dolor en el pecho.  Sbitamente se le hinchan mucho el rostro, las [place], los tobillos, los pies o las piernas.  No ha sentido los movimientos del beb durante Georgianne Fick.  Siente un dolor de cabeza intenso que no se alivia con medicamentos.  Su visin se modifica.   Esta informacin no tiene Theme park manager el consejo del mdico. Asegrese de hacerle al mdico cualquier pregunta que tenga.   Document Released: 11/13/2012 Document Revised: 04/03/2014 Elsevier Interactive Patient Education 2016 [employer]. IAC/InterActiveCorp de Designer, multimedia (Braxton Hicks Contractions) Durante el [place], pueden presentarse contracciones uterinas que no siempre indican que est en [place].  QU SON LAS CONTRACCIONES DE BRAXTON HICKS?  Las [employer] se presentan antes del [place] de  [place] se conocen como contracciones de [place] o falso trabajo de [place]. Hacia el final del embarazo (32 a 34semanas), estas contracciones pueden aparecen con ms frecuencia y volverse ms intensas. No corresponden al Aleen Campi de parto verdadero porque estas contracciones no producen el agrandamiento (la dilatacin) y el afinamiento del cuello del tero. Algunas veces, es difcil distinguirlas del trabajo de parto verdadero porque en algunos casos pueden ser [employer], y las personas tienen diferentes niveles de tolerancia al [employer]. No debe sentirse avergonzada si concurre al hospital con falso trabajo de [place]. En ocasiones, la nica forma de saber si el trabajo de parto es verdadero es que el mdico determine si hay cambios en el cuello del tero. Si no hay problemas prenatales u otras complicaciones de salud asociadas con el embarazo, no habr inconvenientes si la envan a su  casa con Harlene Ramus de parto y espera que comience el verdadero. CMO DIFERENCIAR EL TRABAJO DE PARTO FALSO DEL VERDADERO Falso trabajo de parto  Las contracciones del falso trabajo de parto duran menos y no son tan intensas como las verdaderas.  Generalmente son irregulares.  A menudo, se sienten en la parte delantera de la parte baja del abdomen y en la ingle,  y pueden desaparecer cuando camina o cambia de posicin mientras est acostada.  Las contracciones se vuelven ms dbiles y su duracin es Adult nurse a medida que el tiempo transcurre.  Por lo general, no se hacen progresivamente ms intensas, regulares y Herbalist entre s como en el caso del [place] de parto verdadero. Theodis Blaze de parto 12. Las contracciones del verdadero trabajo de parto duran de 30 a 70segundos, son muy regulares y suelen volverse ms intensas, y [place] su frecuencia. 13. No desaparecen cuando camina. 14. La molestia generalmente se siente en la parte superior del tero y se extiende hacia la zona inferior del abdomen y [employer] cintura. 15. El mdico podr examinarla para determinar si el trabajo de parto es verdadero. El examen mostrar si el cuello del tero se est dilatando y [place]. LO QUE DEBE RECORDAR  Contine haciendo los ejercicios habituales y siga otras indicaciones que el mdico le d.  Tome todos los medicamentos como le indic el mdico.  Oceanographer a las visitas prenatales regulares.  Coma y beba con moderacin si cree que est en trabajo de parto.  Si las contracciones de [employer] provocan incomodidad:  Cambie de posicin: si est acostada o descansando, camine; si est caminando, descanse.  Sintese y descanse en una baera con agua tibia.  Beba 2 o 3vasos de [place]. La deshidratacin puede provocar contracciones.  Respire lenta y profundamente varias veces por hora. CUNDO DEBO BUSCAR ASISTENCIA MDICA INMEDIATA? Solicite atencin mdica de inmediato si:  Las contracciones se intensifican, se hacen ms regulares y Arboriculturist s.  Tiene una prdida de lquido por la vagina.  Tiene fiebre.  Elimina mucosidad manchada con [place].  Tiene una hemorragia vaginal abundante.  Tiene dolor abdominal permanente.  Tiene un dolor en la zona lumbar que nunca tuvo antes.  Siente que la cabeza del beb empuja hacia abajo y ejerce presin en la zona plvica.  El beb no se mueve Dentist.   Esta informacin no tiene Theme park manager el consejo del mdico. Asegrese de hacerle al mdico cualquier pregunta que tenga.   Document Released: 12/21/2004 Document Revised: 03/18/2013 Elsevier Interactive Patient Education [employer]

## 2015-03-13 NOTE — Progress Notes (Signed)
Assisted RN with interpretation of patient assessment.   °Spanish Interpreter °

## 2015-03-13 NOTE — Progress Notes (Signed)
Called by RN and patient interested in sweeping membranes to help with labor. Performed with good effect.   Dilation: 2.5 Effacement (%): 50 Cervical Position: [place]: -3 Presentation: Vertex Exam by:: Efe Fazzino MD  Given return precautions and recommended asking for repeat sweep at next appt.

## 2015-03-13 NOTE — Progress Notes (Signed)
Assisted Pharmacy Tech with interpretation of patient medication.   Spanish Interpreter

## 2015-03-13 NOTE — Progress Notes (Signed)
Assisted registration with interpretation of patient information.  °Spanish Interpreter  °

## 2015-03-17 ENCOUNTER — Ambulatory Visit (INDEPENDENT_AMBULATORY_CARE_PROVIDER_SITE_OTHER): Payer: Self-pay | Admitting: Certified Nurse Midwife

## 2015-03-17 ENCOUNTER — Telehealth (HOSPITAL_COMMUNITY): Payer: Self-pay | Admitting: *Deleted

## 2015-03-17 VITALS — BP 114/65 | HR 95 | Wt 164.0 lb

## 2015-03-17 DIAGNOSIS — Z36 Encounter for antenatal screening of mother: Secondary | ICD-10-CM

## 2015-03-17 DIAGNOSIS — O480 Post-term pregnancy: Secondary | ICD-10-CM

## 2015-03-17 LAB — POCT URINALYSIS DIP (DEVICE)
Bilirubin Urine: NEGATIVE
Glucose, UA: NEGATIVE mg/dL
Ketones, ur: NEGATIVE mg/dL
Leukocytes, UA: NEGATIVE
Nitrite: NEGATIVE
Protein, ur: NEGATIVE mg/dL
Specific Gravity, Urine: 1.02 (ref 1.005–1.030)
Urobilinogen, UA: 0.2 mg/dL (ref 0.0–1.0)
pH: 6.5 (ref 5.0–8.0)

## 2015-03-17 NOTE — Progress Notes (Signed)
Denise PettyJustina Sugg used for interpreter Patient states she is still having the same discharge as she did with BV from November. Patient was treated on 12/7.

## 2015-03-17 NOTE — Progress Notes (Signed)
IOL scheduled 12/24 @ 0700

## 2015-03-17 NOTE — Telephone Encounter (Signed)
Preadmission screen  

## 2015-03-17 NOTE — Patient Instructions (Signed)
Induccin del trabajo de parto  (Labor Induction) Se denomina induccin del trabajo de parto cuando se inician acciones para hacer que una mujer embarazada comience el trabajo de parto. La mayora de las mujeres comienzan el trabajo de parto sin ayuda entre las semanas 37 y 42 del embarazo. Cuando esto no ocurre o cuando hay una necesidad mdica, pueden utilizarse diferentes mtodos para inducirlo. La induccin del trabajo de parto hace que el tero se contraiga. Tambin hace que el cuello del tero se ablandemadure), se abra (se dilate), y se afine (se borre). Generalmente el trabajo de parto no se induce antes de las 39 semanas excepto que haya un problema con el beb o con la madre.  Antes de inducir el trabajo de parto, el mdico considerar cierto nmero de factores incluyendo los siguientes:  El estado del beb.  Cuntas semanas tiene de embarazo.  La madurez de los pulmones del beb.  El estado del cuello del tero.  La posicin del beb. CULES SON LOS MOTIVOS PARA INDUCIR UN PARTO? El trabajo de parto puede inducirse por las siguientes razones:  La salud del beb o de la madre estn en riesgo.  El embarazo se ha pasado de trmino en 1 semana o ms.  Ha roto la bolsa de aguas pero no se ha iniciado el trabajo de parto por s mismo.  La madre tiene algn trastorno de salud o una enfermedad grave, como hipertensin arterial, una infeccin, desprendimiento abrupto de la placenta o diabetes.  Hay escaso lquido amnitico alrededor del beb.  El beb presenta sufrimiento. La conveniencia o el deseo de que el beb nazca en una cierta fecha no es un motivo para inducir el parto. CULES SON LOS MTODOS UTILIZADOS PARA INDUCIR EL TRABAJO DE PARTO? Algunos mtodos de induccin del trabajo de parto son:   Administracin del medicamentos prostaglandina. Este medicamento hace que el cuello uterino se dilate y madure. Este medicamento tambin iniciar las contracciones. Puede tomarse por  boca o insertarse en la vagina en forma de supositorio.  Insercin en la vagina de un tubo delgado (catter) con un baln en el extremo para dilatar el cuello del tero. Una vez insertado, el baln se infla con agua, lo que provoca la apertura del cuello del tero.  Ruptura de las membranas. El mdico separa el saco amnitico del cuello uterino, haciendo que el cuello uterino se distienda y cause la liberacin de la hormona llamada progesterona. Esto hace que el tero se contraiga. Este procedimiento se realiza durante una visita al consultorio mdico. Le indicarn que vuelva a su casa y espere que se inicien las contracciones. Luego tendr que volver para la induccin.  Ruptura de la bolsa de aguas. El mdico romper el saco amnitico con un pequeo instrumento. Una vez que el saco amnitico se rompe, las contracciones deben comenzar. Pueden pasar algunas horas hasta que haga efecto.  Medicamentos que desencadenen o intensifiquen las contracciones. Se lo administrarn a travs de un catter por va intravenosa (IV) que se inserta en una de las venas del brazo. Todos los mtodos de induccin, excepto la ruptura de membranas, se realizan en el hospital. La induccin se realizar en el hospital, de modo que usted y el beb puedan ser controlados cuidadosamente.  CUNTO TIEMPO LLEVA INDUCIR EL TRABAJO DE PARTO? Algunas inducciones pueden demorar entre 2 y 3 das. Generalmente lleva menos tiempo, dependiendo del estado del cuello del tero. Puede tomar ms tiempo si la induccin se realiza en etapas tempranas del embarazo o   es su primer embarazo. Si han pasado 2 o 3 das y no se inicia el trabajo de parto, podrn enviarla a su casa o realizar una cesrea. CULES SON LOS RIESGOS ASOCIADOS CON LA INDUCCiN DEL TRABAJO DE PARTO? Algunos de los riesgos de la induccin son:   Cambios en la frecuencia cardaca fetal, por ejemplo los latidos son demasiado rpidos, o lentos, o errticos.  Riesgo de distrs  fetal.  Posibilidad de infeccin en la madre o el beb.  Aumento de la posibilidad de que sea necesaria una cesrea.  Ruptura (abrupcin) de la placenta del tero (raro).  Ruptura uterina (muy raro). Cuando es necesario realizar la induccin por razones mdicas, los beneficios deben superar a los riesgos. CULES SON ALGUNAS RAZONES PARA NO INDUCIR EL TRABAJO DE PARTO? La induccin no debe realizarse si:   Se demuestra que el beb no tolera el trabajo de parto.  Fue sometida anteriormente a cirugas en el tero, como una miomectoma o le han extirpado fibromas.  La placenta est en una posicin muy baja en el tero y obstruye la abertura del cuello (placenta previa).  El beb no est ubicado con la cabeza hacia bajo.  El cordn umbilical cae hacia el canal de parto, adelante del beb. Esto puede cortar el suministro de sangre y oxgeno al beb.  Fue sometida a una cesrea anteriormente.  Hay circunstancias poco habituales, como que el beb es extremadamente prematuro.   Esta informacin no tiene como fin reemplazar el consejo del mdico. Asegrese de hacerle al mdico cualquier pregunta que tenga.   Document Released: 06/20/2007 Document Revised: 04/03/2014 Elsevier Interactive Patient Education 2016 Elsevier Inc.  

## 2015-03-17 NOTE — Progress Notes (Signed)
Subjective:  Denise Ruiz is a 39 y.o. [phone] at 5023w4d being seen today for ongoing prenatal care.  She is currently monitored for the following issues for this high-risk pregnancy and has Encounter for supervision of high risk multigravida of advanced maternal age, antepartum; Varicose veins during pregnancy, antepartum; AMA (advanced maternal age) multigravida 35+; BV (bacterial vaginosis); and Group B streptococcal infection during pregnancy on her problem list.  Patient reports no complaints.  Contractions: Irritability. Vag. Bleeding: None.  Movement: Present. Denies leaking of fluid.   The following portions of the patient's history were reviewed and updated as appropriate: allergies, current medications, past family history, past medical history, past social history, past surgical history and problem list. Problem list updated.  Objective:   Filed Vitals:   03/17/15 1027  BP: 114/65  Pulse: 95  Weight: 164 lb (74.39 kg)    Fetal Status: Fetal Heart Rate (bpm): NST   Movement: Present     General:  Alert, oriented and cooperative. Patient is in no acute distress.  Skin: Skin is warm and dry. No rash noted.   Cardiovascular: Normal heart rate noted  Respiratory: Normal respiratory effort, no problems with respiration noted  Abdomen: Soft, gravid, appropriate for gestational age. Pain/Pressure: Present     Pelvic: Vag. Bleeding: None Vag D/C Character: Mucous   Cervical exam deferred        Extremities: Normal range of motion.  Edema: Trace  Mental Status: Normal mood and affect. Normal behavior. Normal judgment and thought content.   Urinalysis:      Assessment and Plan:  Pregnancy: [id number] at 7823w4d  1. Post-term pregnancy, 40-42 weeks of gestation Schedule Induction of labor @ 41 weeks per Diane Day RN - Amniotic fluid index with NST; Reactive AFI wnl Swept membranes per pt request  Term labor symptoms and general obstetric precautions including but not limited  to vaginal bleeding, contractions, leaking of fluid and fetal movement were reviewed in detail with the patient. Please refer to After Visit Summary for other counseling recommendations.  No Follow-up on file.   Rhea PinkLori A Katerin Negrete, CNM

## 2015-03-19 ENCOUNTER — Inpatient Hospital Stay (HOSPITAL_COMMUNITY): Payer: Medicaid Other | Admitting: Anesthesiology

## 2015-03-19 ENCOUNTER — Encounter (HOSPITAL_COMMUNITY): Payer: Self-pay

## 2015-03-19 ENCOUNTER — Inpatient Hospital Stay (HOSPITAL_COMMUNITY)
Admission: AD | Admit: 2015-03-19 | Discharge: 2015-03-21 | DRG: 775 | Disposition: A | Discharge status: Home / Self Care | Payer: Medicaid Other | Source: Ambulatory Visit | Attending: Family Medicine | Admitting: Family Medicine

## 2015-03-19 DIAGNOSIS — K219 Gastro-esophageal reflux disease without esophagitis: Secondary | ICD-10-CM | POA: Diagnosis present

## 2015-03-19 DIAGNOSIS — IMO0001 Reserved for inherently not codable concepts without codable children: Secondary | ICD-10-CM

## 2015-03-19 DIAGNOSIS — O99824 Streptococcus B carrier state complicating childbirth: Secondary | ICD-10-CM | POA: Diagnosis present

## 2015-03-19 DIAGNOSIS — B9689 Other specified bacterial agents as the cause of diseases classified elsewhere: Secondary | ICD-10-CM

## 2015-03-19 DIAGNOSIS — N760 Acute vaginitis: Secondary | ICD-10-CM

## 2015-03-19 DIAGNOSIS — O98819 Other maternal infectious and parasitic diseases complicating pregnancy, unspecified trimester: Secondary | ICD-10-CM

## 2015-03-19 DIAGNOSIS — Z8249 Family history of ischemic heart disease and other diseases of the circulatory system: Secondary | ICD-10-CM

## 2015-03-19 DIAGNOSIS — Z3A40 40 weeks gestation of pregnancy: Secondary | ICD-10-CM

## 2015-03-19 DIAGNOSIS — O2200 Varicose veins of lower extremity in pregnancy, unspecified trimester: Secondary | ICD-10-CM

## 2015-03-19 DIAGNOSIS — O9962 Diseases of the digestive system complicating childbirth: Secondary | ICD-10-CM | POA: Diagnosis present

## 2015-03-19 DIAGNOSIS — B951 Streptococcus, group B, as the cause of diseases classified elsewhere: Secondary | ICD-10-CM

## 2015-03-19 DIAGNOSIS — O09523 Supervision of elderly multigravida, third trimester: Secondary | ICD-10-CM

## 2015-03-19 LAB — CBC
HEMATOCRIT: 37.8 % (ref 36.0–46.0)
HEMOGLOBIN: 13.1 g/dL (ref 12.0–15.0)
MCH: 29.7 pg (ref 26.0–34.0)
MCHC: 34.7 g/dL (ref 30.0–36.0)
MCV: 85.7 fL (ref 78.0–100.0)
Platelets: 190 10*3/uL (ref 150–400)
RBC: 4.41 MIL/uL (ref 3.87–5.11)
RDW: 15.8 % — ABNORMAL HIGH (ref 11.5–15.5)
WBC: 9.6 10*3/uL (ref 4.0–10.5)

## 2015-03-19 LAB — TYPE AND SCREEN
ABO/RH(D): B POS
Antibody Screen: NEGATIVE

## 2015-03-19 MED ORDER — LACTATED RINGERS IV SOLN
INTRAVENOUS | Status: DC
  Administered 2015-03-19: 500 mL via INTRAVENOUS

## 2015-03-19 MED ORDER — CITRIC ACID-SODIUM CITRATE 334-500 MG/5ML PO SOLN: 30.0000 mL | ORAL | Status: DC | PRN

## 2015-03-19 MED ORDER — DIPHENHYDRAMINE HCL 50 MG/ML IJ SOLN: 12.5000 mg | INTRAMUSCULAR | Status: DC | PRN

## 2015-03-19 MED ORDER — ONDANSETRON HCL 4 MG/2ML IJ SOLN: 4.0000 mg | Freq: Four times a day (QID) | INTRAMUSCULAR | Status: DC | PRN

## 2015-03-19 MED ORDER — OXYTOCIN 40 UNITS IN LACTATED RINGERS INFUSION - SIMPLE MED
62.5000 mL/h | INTRAVENOUS | Status: DC
  Filled 2015-03-19: qty 1000

## 2015-03-19 MED ORDER — DEXTROSE 5 % IV SOLN
5.0000 10*6.[IU] | Freq: Once | INTRAVENOUS | Status: AC
  Administered 2015-03-19: 5 10*6.[IU] via INTRAVENOUS
  Filled 2015-03-19: qty 5

## 2015-03-19 MED ORDER — PHENYLEPHRINE 40 MCG/ML (10ML) SYRINGE FOR IV PUSH (FOR BLOOD PRESSURE SUPPORT)
80.0000 ug | PREFILLED_SYRINGE | INTRAVENOUS | Status: DC | PRN
  Filled 2015-03-19: qty 2

## 2015-03-19 MED ORDER — OXYCODONE-ACETAMINOPHEN 5-325 MG PO TABS
2.0000 | ORAL_TABLET | ORAL | Status: DC | PRN
Start: 2015-03-19 — End: 2015-03-20

## 2015-03-19 MED ORDER — PHENYLEPHRINE 40 MCG/ML (10ML) SYRINGE FOR IV PUSH (FOR BLOOD PRESSURE SUPPORT)
PREFILLED_SYRINGE | INTRAVENOUS | Status: AC
  Filled 2015-03-19: qty 20

## 2015-03-19 MED ORDER — PENICILLIN G POTASSIUM 5000000 UNITS IJ SOLR
2.5000 10*6.[IU] | INTRAVENOUS | Status: DC
  Administered 2015-03-20 (×2): 2.5 10*6.[IU] via INTRAVENOUS
  Filled 2015-03-19 (×7): qty 2.5

## 2015-03-19 MED ORDER — OXYCODONE-ACETAMINOPHEN 5-325 MG PO TABS: 1.0000 | ORAL_TABLET | ORAL | Status: DC | PRN

## 2015-03-19 MED ORDER — LACTATED RINGERS IV SOLN
500.0000 mL | INTRAVENOUS | Status: DC | PRN
Start: 2015-03-19 — End: 2015-03-20
  Administered 2015-03-20: 500 mL via INTRAVENOUS

## 2015-03-19 MED ORDER — ACETAMINOPHEN 325 MG PO TABS: 650.0000 mg | ORAL_TABLET | ORAL | Status: DC | PRN

## 2015-03-19 MED ORDER — LIDOCAINE HCL (PF) 1 % IJ SOLN
30.0000 mL | INTRAMUSCULAR | Status: DC | PRN
  Filled 2015-03-19: qty 30

## 2015-03-19 MED ORDER — OXYTOCIN BOLUS FROM INFUSION
500.0000 mL | INTRAVENOUS | Status: DC
  Administered 2015-03-20: 500 mL via INTRAVENOUS

## 2015-03-19 MED ORDER — EPHEDRINE 5 MG/ML INJ
10.0000 mg | INTRAVENOUS | Status: DC | PRN
  Filled 2015-03-19: qty 2

## 2015-03-19 MED ORDER — LACTATED RINGERS IV SOLN
INTRAVENOUS | Status: DC
  Administered 2015-03-19: via INTRAVENOUS

## 2015-03-19 MED ORDER — FENTANYL 2.5 MCG/ML BUPIVACAINE 1/10 % EPIDURAL INFUSION (WH - ANES)
14.0000 mL/h | INTRAMUSCULAR | Status: DC | PRN
  Administered 2015-03-20 (×2): 14 mL/h via EPIDURAL
  Filled 2015-03-19: qty 125

## 2015-03-19 MED ORDER — FENTANYL 2.5 MCG/ML BUPIVACAINE 1/10 % EPIDURAL INFUSION (WH - ANES)
INTRAMUSCULAR | Status: AC
  Administered 2015-03-20: 14 mL/h via EPIDURAL
  Filled 2015-03-19: qty 125

## 2015-03-19 NOTE — Anesthesia Preprocedure Evaluation (Signed)
Anesthesia Evaluation  Patient identified by MRN, date of birth, ID band Patient awake    Reviewed: Allergy & Precautions, H&P , NPO status , Patient's Chart, lab work & pertinent test results  Airway Mallampati: II  TM Distance: >3 FB Neck ROM: full    Dental no notable dental hx.    Pulmonary neg pulmonary ROS,    Pulmonary exam normal        Cardiovascular negative cardio ROS Normal cardiovascular exam     Neuro/Psych negative neurological ROS  negative psych ROS   GI/Hepatic Neg liver ROS,   Endo/Other  negative endocrine ROS  Renal/GU negative Renal ROS     Musculoskeletal   Abdominal (+) + obese,   Peds  Hematology negative hematology ROS (+)   Anesthesia Other Findings   Reproductive/Obstetrics (+) Pregnancy                             Anesthesia Physical Anesthesia Plan  ASA: II  Anesthesia Plan: Epidural   Post-op Pain Management:    Induction:   Airway Management Planned:   Additional Equipment:   Intra-op Plan:   Post-operative Plan:   Informed Consent: I have reviewed the patients History and Physical, chart, labs and discussed the procedure including the risks, benefits and alternatives for the proposed anesthesia with the patient or authorized representative who has indicated his/her understanding and acceptance.     Plan Discussed with:   Anesthesia Plan Comments:         Anesthesia Quick Evaluation

## 2015-03-19 NOTE — H&P (Signed)
Denise Ruiz is a 39 y.o. female presenting for Active Labor. Maternal Medical History:  Reason for admission: Contractions.  Nausea.  Contractions: Onset was 1-2 hours ago.   Frequency: regular.   Perceived severity is moderate.    Fetal activity: Perceived fetal activity is normal.   Last perceived fetal movement was within the past hour.    Prenatal complications: No bleeding, PIH, infection, oligohydramnios, pre-eclampsia or preterm labor.   Prenatal Complications - Diabetes: none.    OB History    Gravida Para Term Preterm AB TAB SAB Ectopic Multiple Living   Past Medical History  Diagnosis Date  . Hyperlipemia   . Varicose vein   . GERD (gastroesophageal reflux disease)    Past Surgical History  Procedure Laterality Date  . No past surgeries     Family History: family history includes Cancer in her maternal grandmother; Heart attack in her paternal aunt, paternal aunt, and paternal uncle. Social History:  reports that she has never smoked. She has never used smokeless tobacco. She reports that she does not drink alcohol or use illicit drugs.   Prenatal Transfer Tool  Maternal Diabetes: No Genetic Screening: Normal Maternal Ultrasounds/Referrals: Normal Fetal Ultrasounds or other Referrals:  None Maternal Substance Abuse:  No Significant Maternal Medications:  None Significant Maternal Lab Results:  Lab values include: Group B Strep positive Other Comments:  None  Review of Systems  Constitutional: Negative for fever, chills and malaise/fatigue.  Gastrointestinal: Positive for abdominal pain. Negative for nausea, vomiting, diarrhea and constipation.  Musculoskeletal: Negative for back pain.  Neurological: Negative for dizziness and headaches.    Dilation: 4 Effacement (%): 80 Station: -2 Exam by:: D Simpson RN Blood pressure 133/89, pulse 87, temperature 98.4 F (36.9 C), temperature source Axillary, resp. rate 16, last  menstrual period 05/24/2014, SpO2 100 %. Maternal Exam:  Uterine Assessment: Contraction strength is firm.  Contraction frequency is regular.   Abdomen: Patient reports no abdominal tenderness. Fundal height is 40.   Estimated fetal weight is 7.5-8.   Fetal presentation: vertex  Introitus: Normal vulva. Vagina is positive for vaginal discharge.  Ferning test: not done.  Nitrazine test: not done. Amniotic fluid character: not assessed.  Pelvis: adequate for delivery.   Cervix: Cervix evaluated by digital exam.     Fetal Exam Fetal Monitor Review: Mode: ultrasound.   Baseline rate: 140.  Variability: moderate (6-25 bpm).   Pattern: accelerations present and no decelerations.    Fetal State Assessment: Category I - tracings are normal.     Physical Exam  Constitutional: She is oriented to person, place, and time. She appears well-developed and well-nourished. No distress.  HENT:  Head: Normocephalic.  Cardiovascular: Normal rate and regular rhythm.   Respiratory: Effort normal and breath sounds normal. No respiratory distress. She has no wheezes. She has no rales.  GI: Soft. She exhibits no distension. There is no tenderness. There is no rebound and no guarding.  Genitourinary: Vaginal discharge found.  Dilation: 4 Effacement (%): 80 Station: -2 Presentation: Vertex Exam by:: D Simpson RN   Musculoskeletal: Normal range of motion.  Neurological: She is alert and oriented to person, place, and time.  Skin: Skin is warm and dry.  Psychiatric: She has a normal mood and affect.    Prenatal labs: ABO, Rh: B/POS/-- (05/31 1611) Antibody: NEG (05/31 1611) Rubella: 2.40 (05/31 1611) RPR: NON REAC (09/16 1032)  HBsAg: NEGATIVE (05/31  1611)  HIV: NONREACTIVE (09/16 1032)  GBS: Positive (11/23 0000)   Assessment/Plan: SIUP at 5882w6d Active labor GBS +  [facility] Routine orders PCN for GBS Epidural per request Anticipate  SVD    [facility] Hospital[facility]ILLIAMS,MARIE 03/19/2015, 11:33 PM

## 2015-03-19 NOTE — MAU Note (Signed)
Pt c/o contractions every 5 mins. Denies LOF. Having some bloody show. Was 2cm on Wednesday. +FM

## 2015-03-20 ENCOUNTER — Encounter (HOSPITAL_COMMUNITY): Payer: Self-pay | Admitting: *Deleted

## 2015-03-20 ENCOUNTER — Inpatient Hospital Stay (HOSPITAL_COMMUNITY): Admission: RE | Admit: 2015-03-20 | Payer: Self-pay | Source: Ambulatory Visit

## 2015-03-20 DIAGNOSIS — O99824 Streptococcus B carrier state complicating childbirth: Secondary | ICD-10-CM

## 2015-03-20 DIAGNOSIS — O09523 Supervision of elderly multigravida, third trimester: Secondary | ICD-10-CM

## 2015-03-20 DIAGNOSIS — Z3A40 40 weeks gestation of pregnancy: Secondary | ICD-10-CM

## 2015-03-20 LAB — ABO/RH: ABO/RH(D): B POS

## 2015-03-20 LAB — RPR: RPR Ser Ql: NONREACTIVE

## 2015-03-20 MED ORDER — TERBUTALINE SULFATE 1 MG/ML IJ SOLN
0.2500 mg | Freq: Once | INTRAMUSCULAR | Status: AC
Start: 2015-03-20 — End: 2015-03-20
  Administered 2015-03-20: 0.25 mg via SUBCUTANEOUS

## 2015-03-20 MED ORDER — LACTATED RINGERS IV SOLN
INTRAVENOUS | Status: DC
  Administered 2015-03-20: 02:00:00 via INTRAUTERINE

## 2015-03-20 MED ORDER — SALINE SPRAY 0.65 % NA SOLN
1.0000 | NASAL | Status: DC | PRN
  Administered 2015-03-20: 1 via NASAL
  Filled 2015-03-20: qty 44

## 2015-03-20 MED ORDER — SENNOSIDES-DOCUSATE SODIUM 8.6-50 MG PO TABS
2.0000 | ORAL_TABLET | ORAL | Status: DC
  Administered 2015-03-20 – 2015-03-21 (×2): 2 via ORAL
  Filled 2015-03-20 (×2): qty 2

## 2015-03-20 MED ORDER — DIBUCAINE 1 % RE OINT: 1.0000 "application " | TOPICAL_OINTMENT | RECTAL | Status: DC | PRN

## 2015-03-20 MED ORDER — WITCH HAZEL-GLYCERIN EX PADS: 1.0000 "application " | MEDICATED_PAD | CUTANEOUS | Status: DC | PRN

## 2015-03-20 MED ORDER — IBUPROFEN 600 MG PO TABS
600.0000 mg | ORAL_TABLET | Freq: Four times a day (QID) | ORAL | Status: DC | PRN
  Administered 2015-03-20: 600 mg via ORAL
  Filled 2015-03-20: qty 1

## 2015-03-20 MED ORDER — ACETAMINOPHEN 325 MG PO TABS
650.0000 mg | ORAL_TABLET | ORAL | Status: DC | PRN
  Administered 2015-03-21: 650 mg via ORAL
  Filled 2015-03-20: qty 2

## 2015-03-20 MED ORDER — TETANUS-DIPHTH-ACELL PERTUSSIS 5-2.5-18.5 LF-MCG/0.5 IM SUSP: 0.5000 mL | Freq: Once | INTRAMUSCULAR | Status: DC

## 2015-03-20 MED ORDER — OXYTOCIN 40 UNITS IN LACTATED RINGERS INFUSION - SIMPLE MED: 62.5000 mL/h | INTRAVENOUS | Status: DC | PRN

## 2015-03-20 MED ORDER — IBUPROFEN 600 MG PO TABS
600.0000 mg | ORAL_TABLET | Freq: Four times a day (QID) | ORAL | Status: DC
  Administered 2015-03-20 – 2015-03-21 (×6): 600 mg via ORAL
  Filled 2015-03-20 (×6): qty 1

## 2015-03-20 MED ORDER — BENZOCAINE-MENTHOL 20-0.5 % EX AERO
1.0000 "application " | INHALATION_SPRAY | Freq: Every day | CUTANEOUS | Status: DC | PRN
  Administered 2015-03-20: 1 via TOPICAL
  Filled 2015-03-20: qty 56

## 2015-03-20 MED ORDER — LANOLIN HYDROUS EX OINT: TOPICAL_OINTMENT | CUTANEOUS | Status: DC | PRN

## 2015-03-20 MED ORDER — ONDANSETRON HCL 4 MG PO TABS: 4.0000 mg | ORAL_TABLET | ORAL | Status: DC | PRN

## 2015-03-20 MED ORDER — ZOLPIDEM TARTRATE 5 MG PO TABS: 5.0000 mg | ORAL_TABLET | Freq: Every evening | ORAL | Status: DC | PRN

## 2015-03-20 MED ORDER — BENZOCAINE-MENTHOL 20-0.5 % EX AERO: 1.0000 "application " | INHALATION_SPRAY | CUTANEOUS | Status: DC | PRN

## 2015-03-20 MED ORDER — SIMETHICONE 80 MG PO CHEW: 80.0000 mg | CHEWABLE_TABLET | ORAL | Status: DC | PRN

## 2015-03-20 MED ORDER — DIPHENHYDRAMINE HCL 25 MG PO CAPS: 25.0000 mg | ORAL_CAPSULE | Freq: Four times a day (QID) | ORAL | Status: DC | PRN

## 2015-03-20 MED ORDER — LIDOCAINE HCL (PF) 1 % IJ SOLN
INTRAMUSCULAR | Status: DC | PRN
  Administered 2015-03-20: 8 mL via EPIDURAL
  Administered 2015-03-20: 7 mL via EPIDURAL

## 2015-03-20 MED ORDER — ONDANSETRON HCL 4 MG/2ML IJ SOLN: 4.0000 mg | INTRAMUSCULAR | Status: DC | PRN

## 2015-03-20 MED ORDER — MEASLES, MUMPS & RUBELLA VAC ~~LOC~~ INJ: 0.5000 mL | INJECTION | Freq: Once | SUBCUTANEOUS | Status: DC

## 2015-03-20 MED ORDER — TERBUTALINE SULFATE 1 MG/ML IJ SOLN
INTRAMUSCULAR | Status: AC
  Filled 2015-03-20: qty 1

## 2015-03-20 NOTE — Lactation Note (Signed)
This note was copied from the chart of Denise Ruiz. Lactation Consultation Note  Patient Name: Denise Ruiz UJWJX'BToday's Date: 03/20/2015 Reason for consult: Initial assessment Baby at 10 hr of life and mom reports bf is going well. No questions or concerns at this time. Denies breast or nipple pain. Discussed baby behavior, feeding frequency, voids, breast changes, and nipple care. Mom stated that she can manually express and has seen colostrum bilaterally. Given lactation handouts. Aware of OP services and support group.     Maternal Data Has patient been taught Hand Expression?: Yes Does the patient have breastfeeding experience prior to this delivery?: Yes  Feeding Feeding Type: Breast Fed Length of feed: 10 min  LATCH Score/Interventions Latch: Grasps breast easily, tongue down, lips flanged, rhythmical sucking. Intervention(s): Adjust position;Assist with latch  Audible Swallowing: Spontaneous and intermittent Intervention(s): Skin to skin;Hand expression  Type of Nipple: Everted at rest and after stimulation  Comfort (Breast/Nipple): Soft / non-tender     Hold (Positioning): Assistance needed to correctly position infant at breast and maintain latch.  LATCH Score: 9  Lactation Tools Discussed/Used WIC Program: Yes   Consult Status Consult Status: Follow-up Date: 03/21/15 Follow-up type: In-patient    Rulon Eisenmengerlizabeth E Avarie Tavano 03/20/2015, 8:55 PM

## 2015-03-20 NOTE — Progress Notes (Signed)
Patient ID: Denise Ruiz, female   DOB: 11-08-1975, 39 y.o.   MRN: [id number] FHR developed variable/late decelerations.  Spontaneous contractions every 2-3 min FHR now recovered for past 2 UCs  IUPC inserted  Will start amnioinfusion  Dilation: 7 Effacement (%): 70 Station: -2 Presentation: Vertex Exam by:: M.Merrill, RN  Anterior cervix getting puffy

## 2015-03-20 NOTE — Progress Notes (Signed)
Checked on patients needs and ordered patients dinner and breakfast.  °Spanish Interpreter  °

## 2015-03-20 NOTE — Progress Notes (Signed)
Assisted Anaesthesia with interpretation of follow up with patient.  Spanish Interpreter

## 2015-03-20 NOTE — Anesthesia Procedure Notes (Addendum)
Epidural Patient location during procedure: OB Start time: 03/19/2015 11:59 PM End time: 03/20/2015 12:03 AM  Staffing Anesthesiologist: Leilani AbleHATCHETT, Kanyia Heaslip Performed by: anesthesiologist   Preanesthetic Checklist Completed: patient identified, surgical consent, pre-op evaluation, timeout performed, IV checked, risks and benefits discussed and monitors and equipment checked  Epidural Patient position: sitting Prep: site prepped and draped and DuraPrep Patient monitoring: continuous pulse ox and blood pressure Approach: midline Location: L3-L4 Injection technique: LOR air  Needle:  Needle type: Tuohy  Needle gauge: 17 G Needle length: 9 cm and 9 Needle insertion depth: 5 cm cm Catheter type: closed end flexible Catheter size: 19 Gauge Catheter at skin depth: 10 cm Test dose: negative and Other  Assessment Sensory level: T9 Events: blood not aspirated, injection not painful, no injection resistance, negative IV test and no paresthesia  Additional Notes Reason for block:procedure for pain

## 2015-03-20 NOTE — Progress Notes (Signed)
Checked on patients needs.  °Spanish Interpreter  °

## 2015-03-21 MED ORDER — ACETAMINOPHEN 325 MG PO TABS: 650.0000 mg | ORAL_TABLET | ORAL | Status: DC | PRN

## 2015-03-21 MED ORDER — SENNOSIDES-DOCUSATE SODIUM 8.6-50 MG PO TABS: 1.0000 | ORAL_TABLET | Freq: Every evening | ORAL | Status: DC | PRN

## 2015-03-21 MED ORDER — IBUPROFEN 600 MG PO TABS: 600.0000 mg | ORAL_TABLET | Freq: Four times a day (QID) | ORAL | Status: DC

## 2015-03-21 NOTE — Progress Notes (Signed)
Checked on patients needs.  Ordered patient dinner and breakfast and snack.   Spanish interpreter

## 2015-03-21 NOTE — Discharge Summary (Signed)
OB Discharge Summary     Patient Name: Denise Ruiz DOB: 08/28/1975 MRN: [id number]  Date of admission: 03/19/2015 Delivering MD: Shonna Chock [place]   Date of discharge: 03/21/2015  Admitting diagnosis: 41w ctx Intrauterine pregnancy: [redacted]w[redacted]d     Secondary diagnosis:  Active Problems:   Active labor at term   Shoulder dystocia, delivered, current hospitalization  Additional problems: none     Discharge diagnosis: Term Pregnancy Delivered                                                                                                Post partum procedures:none  Augmentation: none  Complications: shoulder dystocia  Hospital course:  Onset of Labor With Vaginal Delivery     39 y.o. yo [id number] at [redacted]w[redacted]d was admitted in Latent Laboron 03/19/2015. Patient had an uncomplicated labor course as follows:  Membrane Rupture Time/Date: 12:03 AM ,03/20/2015   Intrapartum Procedures: Episiotomy: None [1]                                         Lacerations:  None [1]  Patient had a delivery of a Viable infant. 03/20/2015  Information for the patient's newborn:  Denise Ruiz, Denise Ruiz [[id number]]  Delivery Method: Vaginal, Spontaneous Delivery (Filed from Delivery Summary)   Patient presented the evening of 12/23 in active labor. She progressed without augmentation. She experienced intermittent late decels and then at around 8:30 AM day of delivery recurrent late decels. Terbutaline was given and FHTs improved. She again experienced recurrent late decels and was found to have an anterior lip which she was able to push past. Meconium-stained fluid was noted at the onset of pushing. GBS positive, adequately treated. 1 minute shoulder dystocia resoved with mcroberts, suprapubic pressure, and rotation of anterior shoulder.   Pateint had an uncomplicated postpartum course.  She is ambulating, tolerating a regular diet, passing flatus, and urinating well. Patient is discharged home in  stable condition on 03/21/2015.   Physical exam  Filed Vitals:   03/20/15 1315 03/20/15 1415 03/20/15 1810 03/21/15 0525  BP: 110/64 101/52 108/62 102/61  Pulse: 88 88 84 81  Temp: 99.3 F (37.4 C) 98.4 F (36.9 C) 98.3 F (36.8 C) 97.6 F (36.4 C)  TempSrc: Oral Oral Oral Oral  Resp: Height:      Weight:      SpO2:       General: alert, cooperative and no distress Lochia: appropriate Uterine Fundus: firm Incision: N/A DVT Evaluation: No cords or calf tenderness. No significant calf/ankle edema. Labs: Lab Results  Component Value Date   WBC 9.6 03/19/2015   HGB 13.1 03/19/2015   HCT 37.8 03/19/2015   MCV 85.7 03/19/2015   PLT 190 03/19/2015   No flowsheet data found.  Discharge instruction: per After Visit Summary and "Baby and Me Booklet".  After visit meds:    Medication List    TAKE these medications        acetaminophen 325  MG tablet  Commonly known as:  TYLENOL  Take 2 tablets (650 mg total) by mouth every 4 (four) hours as needed (for pain scale < 4).     ibuprofen 600 MG tablet  Commonly known as:  ADVIL,MOTRIN  Take 1 tablet (600 mg total) by mouth every 6 (six) hours.     Prenatal Vitamins 0.8 MG tablet  Take 1 tablet by mouth daily.     senna-docusate 8.6-50 MG tablet  Commonly known as:  Senokot-S  Take 1 tablet by mouth at bedtime as needed for mild constipation.        Diet: routine diet  Activity: Advance as tolerated. Pelvic rest for 6 weeks.   Outpatient follow up:6 weeks Follow up Appt:Future Appointments Date Time Provider Department Center  04/22/2015 12:45 PM Adam PhenixJames G Arnold, MD [facility]   Follow up Visit:No Follow-up on file.  Postpartum contraception: undecided, counseled in [facility]  Newborn Data: Live born female  Birth Weight: 8 lb 4.1 oz (3745 g) APGAR: 6, 8  Baby Feeding: Breast Disposition:disposition to be determined   03/21/2015 Denise BilisNoah B Wouk, MD

## 2015-03-21 NOTE — Discharge Instructions (Signed)

## 2015-03-21 NOTE — Anesthesia Postprocedure Evaluation (Signed)
Anesthesia Post Note  Patient: Denise Ruiz  Procedure(s) Performed: * No procedures listed *  Patient location during evaluation: Mother Baby Anesthesia Type: Epidural Level of consciousness: awake Pain management: satisfactory to patient Vital Signs Assessment: post-procedure vital signs reviewed and stable Respiratory status: spontaneous breathing Cardiovascular status: stable Anesthetic complications: no    Last Vitals:  Filed Vitals:   03/20/15 1810 03/21/15 0525  BP: 108/62 102/61  Pulse: 84 81  Temp: 36.8 C 36.4 C  Resp: 18 18    Last Pain:  Filed Vitals:   03/21/15 0623  PainSc: 0-No pain                 [name]

## 2015-03-21 NOTE — Lactation Note (Signed)
This note was copied from the chart of Denise Ruiz. Lactation Consultation Note Follow up visit at 33 hours of age.  Interpreter at bedside.  Mom reports good feedings with recent feeding <1 hour ago.     Assisted mom with hand expression with colostrum visible.  Encouraged mom to express before and after feedings and rub EBM onto nipples.  MBU RN reports good latch with previous feeding.   Mom requests hand pump for use at home as needed.  Mom is offering FO and breast.  Encouraged 8-12 feedings each 24 hours.  Mom denies further concerns.   Patient Name: Denise Ann Makirgelia Peralta Ruiz QMVHQ'IToday's Date: 03/21/2015 Reason for consult: Follow-up assessment   Maternal Data    Feeding Feeding Type: Breast Fed Length of feed: 15 min  LATCH Score/Interventions Latch: Grasps breast easily, tongue down, lips flanged, rhythmical sucking.  Audible Swallowing: A few with stimulation Intervention(s): Hand expression  Type of Nipple: Everted at rest and after stimulation  Comfort (Breast/Nipple): Soft / non-tender     Hold (Positioning): No assistance needed to correctly position infant at breast.  LATCH Score: 9  Lactation Tools Discussed/Used     Consult Status Consult Status: Follow-up Date: 03/22/15 Follow-up type: In-patient    Corry Storie, Arvella MerlesJana Lynn 03/21/2015, 7:40 PM

## 2015-03-21 NOTE — Progress Notes (Signed)
Checked on patients needs.  °Spanish interpreter  °

## 2015-03-22 ENCOUNTER — Ambulatory Visit: Payer: Self-pay

## 2015-03-22 NOTE — Lactation Note (Signed)
This note was copied from the chart of Denise Heydy Energy East CorporationPeralta Ruiz. Lactation Consultation Note  Follow up visit made prior to discharge.  Mom starting supplementing with formula yesterday.  She felt baby was still hungry after breastfeeding.  Discussed milk coming to volume.  Mom states breasts are filling.  Recommended she stop formula as breasts become heavier so milk supply is not affected.  Outpatient lactation services and support information reviewed and encouraged.  Patient Name: Denise Ruiz ONGEX'BToday's Date: 03/22/2015     Maternal Data    Feeding Feeding Type: Breast Fed Length of feed: 15 min  LATCH Score/Interventions                      Lactation Tools Discussed/Used     Consult Status      Huston FoleyMOULDEN, Mabell Esguerra S 03/22/2015, 10:29 AM

## 2015-04-22 ENCOUNTER — Encounter: Payer: Self-pay | Admitting: Obstetrics & Gynecology

## 2015-04-22 ENCOUNTER — Ambulatory Visit (INDEPENDENT_AMBULATORY_CARE_PROVIDER_SITE_OTHER): Payer: Self-pay | Admitting: Obstetrics & Gynecology

## 2015-04-22 VITALS — BP 108/67 | HR 67 | Temp 99.0°F

## 2015-04-22 DIAGNOSIS — Z30011 Encounter for initial prescription of contraceptive pills: Secondary | ICD-10-CM

## 2015-04-22 LAB — POCT PREGNANCY, URINE
PREG TEST UR: NEGATIVE
Preg Test, Ur: NEGATIVE

## 2015-04-22 MED ORDER — NORETHINDRONE 0.35 MG PO TABS: 1.0000 | ORAL_TABLET | Freq: Every day | ORAL | Status: DC

## 2015-04-22 NOTE — Patient Instructions (Signed)
Informacin sobre los anticonceptivos orales (Oral Contraception Information) Los anticonceptivos orales (ACO) son medicamentos que se utilizan para evitar el embarazo. Su funcin es evitar que los ovarios liberen vulos. Las hormonas de los ACO tambin hacen que el moco cervical se haga ms espeso, lo que evita que el esperma ingrese al tero. Tambin hacen que la membrana que recubre internamente al tero se vuelva ms fina, lo que no permite que el huevo fertilizado se adhiera a la pared del tero. Los ACO son muy efectivos cuando se toman exactamente como se prescriben. Sin embargo, no previenen contra las enfermedades de transmisin sexual (ETS). La prctica del sexo seguro, como el uso de preservativos, junto con la pldora, ayudan a prevenir ese tipo de enfermedades.  Antes de tomar la pldora, usted debe hacerse un examen fsico y un test de Pap. El mdico podr indicarle anlisis de sangre, si es necesario. El mdico se asegurar de que usted sea una buena candidata para usar anticonceptivos orales. Converse con su mdico acerca de los posibles efectos secundarios de los ACO que podran recetarle. Cuando se inicia el uso de ACO, puede llevar 2 a 3 meses para que su organismo se adapte a los cambios en los niveles hormonales.  TIPOS DE ANTICONCEPTIVOS ORALES  Pldora combinada: esta pldora contiene las hormonas estrgeno y progestina (progesterona sinttica). La pldora combinada viene en envases para 21 das, 28 das o 91 das. Algunos tipos de pldoras combinadas deben tomarse de manera continua (pldoras para 365 das). En los envases para 21 das, usted no tomar las pldoras durante 7 das despus de la ltima pldora. En los envases para 28 das, la pldora se toma todos los das. Las ltimas 7 no contienen hormonas. Ciertos tipos de pldoras tienen ms de 21 pldoras que contienen hormonas. En los envases para 91 das, las primeras 84 pldoras contienen ambas hormonas y las ltimas 7 pldoras  no contienen hormonas o contienen slo estrgenos.  La minipldora: esta pldora contiene la hormona progesterona solamente. Es necesario tomarla todos los das de manera continua. Es importante que las tome a la misma hora todos los das. Viene en envases de 28 pldoras. Las 28 pldoras contienen la hormona.  VENTAJAS DE LOS ANTICONCEPTIVOS ORALES  Disminuye los sntomas premenstruales.   Se usa para tratar los clicos menstruales.   Regula el ciclo menstrual.   Disminuye el ciclo menstrual abundante.   Puede mejorar el acn, segn el tipo de pldora.   Trata hemorragias uterinas anormales.   Trata el sndrome ovrico poliqustico.   Trata la endometriosis.   Pueden usarse como anticonceptivo de emergencia.  FACTORES QUE PUEDEN HACER QUE LOS ANTICONCEPTIVOS ORALES SEAN MENOS EFECTIVOS Pueden ser menos efectivos si:   Olvid tomar la pldora todos los das a la misma hora.   Tiene una enfermedad estomacal o intestinal que disminuye la absorcin de la pldora.   Ingiere simultneamente los anticonceptivos orales junto con otros medicamentos que los hacen menos efectivos, como antibiticos, ciertos medicamentos para el VIH y algunos medicamentos para las convulsiones.   Usted toma anticonceptivos orales que han vencido.   Cuando se usa el envase de 21 das, se olvida de recomenzar el uso en el da 7.  RIESGOS ASOCIADOS AL USO DE ANTICONCEPTIVOS ORALES  Los anticonceptivos orales pueden en algunos casos causar efectos secundarios como:  Dolor de cabeza.  Nuseas.  Inflamacin mamaria.  Hemorragia vaginal o manchado irregular. Las pldoras combinadas tambin se asocian a un pequeo aumento en el riesgo de:  Cogulos   sanguneos.  Ataque cardaco.  Ictus.   Esta informacin no tiene como fin reemplazar el consejo del mdico. Asegrese de hacerle al mdico cualquier pregunta que tenga.   Document Released: 12/21/2004 Document Revised:  01/01/2013 Elsevier Interactive Patient Education 2016 Elsevier Inc.  

## 2015-04-22 NOTE — Progress Notes (Signed)
Patient ID: Denise Ruiz, female   DOB: 02/21/1976, 40 y.o.   MRN: [id number] Subjective:     Denise Ruiz is a 40 y.o. female who presents for a postpartum visit. She is 4 weeks postpartum following a spontaneous vaginal delivery. I have fully reviewed the prenatal and intrapartum course. The delivery was at 40 gestational weeks. Outcome: spontaneous vaginal delivery. Anesthesia: epidural. Postpartum course has been normal. Baby's course has been good. Baby is feeding by breast. Bleeding staining only. Bowel function is normal. Bladder function is normal. Patient is not sexually active. Contraception method is oral progesterone-only contraceptive. Postpartum depression screening: negative.  The following portions of the patient's history were reviewed and updated as appropriate: allergies, current medications, past family history, past medical history, past social history, past surgical history and problem list.  Review of Systems Pertinent items are noted in HPI.   Objective:    BP 108/67 mmHg  Pulse 67  Temp(Src) 99 F (37.2 C)  Breastfeeding? Yes  General:  alert, cooperative and no distress           Abdomen: soft, non-tender; bowel sounds normal; no masses,  no organomegaly   Vulva:  normal  Vagina: normal vagina  Cervix:  no lesions and spotting only  Corpus: not examined  Adnexa:  not evaluated  Rectal Exam: Not performed.        Assessment:     normal postpartum exam. Pap smear not done at today's visit.   Plan:    1. Contraception: oral progesterone-only contraceptive 2. Rx sent to Rx 3. Follow up as needed.    Adam Phenix, MD 04/22/2015

## 2015-10-23 ENCOUNTER — Other Ambulatory Visit: Payer: Self-pay | Admitting: Obstetrics & Gynecology

## 2016-02-03 ENCOUNTER — Other Ambulatory Visit: Payer: Self-pay | Admitting: Obstetrics & Gynecology

## 2017-03-06 ENCOUNTER — Other Ambulatory Visit (HOSPITAL_COMMUNITY): Payer: Self-pay | Admitting: Obstetrics and Gynecology

## 2017-03-06 ENCOUNTER — Other Ambulatory Visit: Payer: Self-pay | Admitting: Obstetrics and Gynecology

## 2017-03-06 DIAGNOSIS — N611 Abscess of the breast and nipple: Secondary | ICD-10-CM

## 2017-03-08 ENCOUNTER — Encounter (HOSPITAL_COMMUNITY): Payer: Self-pay

## 2017-03-08 ENCOUNTER — Ambulatory Visit (HOSPITAL_COMMUNITY)
Admission: RE | Admit: 2017-03-08 | Discharge: 2017-03-08 | Disposition: A | Discharge status: Home / Self Care | Payer: Self-pay | Source: Ambulatory Visit | Attending: Obstetrics and Gynecology | Admitting: Obstetrics and Gynecology

## 2017-03-08 ENCOUNTER — Ambulatory Visit
Admission: RE | Admit: 2017-03-08 | Discharge: 2017-03-08 | Disposition: A | Discharge status: Home / Self Care | Payer: No Typology Code available for payment source | Source: Ambulatory Visit | Attending: Obstetrics and Gynecology | Admitting: Obstetrics and Gynecology

## 2017-03-08 ENCOUNTER — Ambulatory Visit: Payer: Self-pay

## 2017-03-08 ENCOUNTER — Other Ambulatory Visit: Payer: Self-pay | Admitting: Obstetrics and Gynecology

## 2017-03-08 VITALS — BP 120/80 | Temp 98.5°F | Wt 140.2 lb

## 2017-03-08 DIAGNOSIS — R2232 Localized swelling, mass and lump, left upper limb: Secondary | ICD-10-CM

## 2017-03-08 DIAGNOSIS — N611 Abscess of the breast and nipple: Secondary | ICD-10-CM

## 2017-03-08 DIAGNOSIS — Z1239 Encounter for other screening for malignant neoplasm of breast: Secondary | ICD-10-CM

## 2017-03-08 NOTE — Progress Notes (Addendum)
Complaints of left breast pain and lump x 15 days. Patient states the pain comes and goes. Patient rates the pain at a 6 out of 10. Patient complained of bilateral breast discharge since quit breastfeeding in January that is clear to white when expressed.   Pap Smear: Pap smear not completed today. [facility] and normal. Per patient has no history of an abnormal Pap smear. Unable to complete patients Pap smear today due to patient is currently on menstrual period. [facility], 2017 at 1530 for a Pap smear. Last Pap smear result is in Epic.  Physical exam: Breasts Breasts symmetrical. No skin abnormalities bilateral breasts. No nipple retraction bilateral breasts. Bilateral breast nipple discharge expressed on exam. The left breast was white and milky appearing. The right breast was clear to milky. Sample of breast discharge from each breast sent to cytology for evaluation. No lymphadenopathy. No lumps palpated right breast. Palpated a lump within the left axilla at 2 o'clock 8 cm from the nipple. Complaints of tenderness when palpated lump. [facility] and possible left breast ultrasound. Appointment scheduled for Thursday, March 08, 2017 at 0930.  Pelvic/Bimanual No Pap smear completed today since patient is currently on menstrual period.  Smoking History: Patient has never smoked.  Patient Navigation: Patient education provided. [facility]. Spanish interpreter provided.  Used Spanish interpreter [employer]rika McReynolds from [employer].

## 2017-03-08 NOTE — Addendum Note (Signed)
Encounter addended by: Priscille HeidelbergBrannock, Vendela Troung P, RN on: 03/08/2017 9:33 AM  Actions taken: Sign clinical note

## 2017-03-08 NOTE — Patient Instructions (Addendum)
Explained breast self awareness with Ann MakiArgelia Peralta Cortes. Unable to complete patients Pap smear today due to patient is currently on menstrual period. [facility] for a Pap smear. Let her know [facility] will cover Pap smears every 3 years unless has a history of abnormal Pap smears. [facility] and possible left breast ultrasound. Appointment scheduled for Thursday, March 08, 2017 at 0930. Let patient know will call her within the next couple of weeks with results of breast discharge by phone. Willia CrazeArgelia Peralta Cortes verbalized understanding.  Brisa Auth, Kathaleen Maserhristine Poll, RN 8:13 AM

## 2017-03-09 ENCOUNTER — Encounter (HOSPITAL_COMMUNITY): Payer: Self-pay

## 2017-03-13 ENCOUNTER — Encounter (HOSPITAL_COMMUNITY): Payer: Self-pay

## 2017-04-10 ENCOUNTER — Encounter (HOSPITAL_COMMUNITY): Payer: Self-pay | Admitting: *Deleted

## 2017-04-10 ENCOUNTER — Ambulatory Visit (HOSPITAL_COMMUNITY)
Admission: RE | Admit: 2017-04-10 | Discharge: 2017-04-10 | Disposition: A | Discharge status: Home / Self Care | Payer: No Typology Code available for payment source | Source: Ambulatory Visit | Attending: Obstetrics and Gynecology | Admitting: Obstetrics and Gynecology

## 2017-04-10 VITALS — BP 120/82 | Ht 59.0 in | Wt 142.0 lb

## 2017-04-10 DIAGNOSIS — Z01419 Encounter for gynecological examination (general) (routine) without abnormal findings: Secondary | ICD-10-CM

## 2017-04-10 NOTE — Progress Notes (Signed)
No complaints today.   Pap Smear: Pap smear completed today. [facility] and normal. Per patient has no history of an abnormal Pap smear. Last Pap smear result is in Epic.  Pelvic/Bimanual   Ext Genitalia No lesions, no swelling and no discharge observed on external genitalia.         Vagina Vagina pink and normal texture. No lesions or discharge observed in vagina.          Cervix Cervix is present. Cervix pink and of normal texture. No discharge observed.     Uterus Uterus is present and palpable. Uterus in normal position and normal size.        Adnexae Bilateral ovaries present and palpable. No tenderness on palpation.          Rectovaginal No rectal exam completed today since patient had no rectal complaints. No skin abnormalities observed on exam.    Smoking History: Patient has never smoked.  Patient Navigation: Patient education provided. [facility]. Spanish interpreter provided.  Used Spanish interpreter [employer]rika McReynolds from [employer].

## 2017-04-10 NOTE — Progress Notes (Signed)
.  pap

## 2017-04-10 NOTE — Patient Instructions (Signed)
Explained to [name] that [facility] will cover Pap smears and HPV typing every 5 years unless has a history of abnormal Pap smears. Let patient know will follow up with her within the next couple weeks with results with results of Pap smear by letter or phone. [name] verbalized understanding.  Nafeesah Lapaglia, Kathaleen Maserhristine Poll, RN 3:51 PM

## 2017-04-12 LAB — CYTOLOGY - PAP
Diagnosis: NEGATIVE
HPV (WINDOPATH): NOT DETECTED

## 2017-04-25 ENCOUNTER — Encounter (HOSPITAL_COMMUNITY): Payer: Self-pay | Admitting: *Deleted

## 2017-04-25 NOTE — Progress Notes (Signed)
Letter mailed to patient with negative pap smear results.  

## 2017-08-09 ENCOUNTER — Ambulatory Visit: Payer: Self-pay | Admitting: Internal Medicine

## 2017-08-09 ENCOUNTER — Encounter: Payer: Self-pay | Admitting: Internal Medicine

## 2017-08-09 VITALS — BP 120/82 | HR 70 | Resp 12 | Ht <= 58 in | Wt 145.0 lb

## 2017-08-09 DIAGNOSIS — R6882 Decreased libido: Secondary | ICD-10-CM

## 2017-08-09 DIAGNOSIS — E782 Mixed hyperlipidemia: Secondary | ICD-10-CM

## 2017-08-09 DIAGNOSIS — J329 Chronic sinusitis, unspecified: Secondary | ICD-10-CM | POA: Insufficient documentation

## 2017-08-09 DIAGNOSIS — N83202 Unspecified ovarian cyst, left side: Secondary | ICD-10-CM

## 2017-08-09 DIAGNOSIS — R42 Dizziness and giddiness: Secondary | ICD-10-CM

## 2017-08-09 DIAGNOSIS — N83201 Unspecified ovarian cyst, right side: Secondary | ICD-10-CM | POA: Insufficient documentation

## 2017-08-09 DIAGNOSIS — J3089 Other allergic rhinitis: Secondary | ICD-10-CM

## 2017-08-09 MED ORDER — CETIRIZINE HCL 10 MG PO TABS: 10.0000 mg | ORAL_TABLET | Freq: Every day | ORAL | 11 refills | Status: DC

## 2017-08-09 NOTE — Progress Notes (Signed)
Subjective:    Patient ID: Denise Ruiz, female    DOB: 04/26/1975, 42 y.o.   MRN: [id number]  HPI   Here to establish  1.  Problems with menstrual cycle:  Has been taking BCPs for about 5-6 months. She likes not being worried about pregnancy with the BCPs and the regulation of her periods, but feels she has decreased interest in sex. Prior to starting the BCPs, she could be 15 days late with her periods. She did have cramping days prior to her periods and those have decreased significantly with the BCPs. Periods are regular now as well.  2.  Hyperlipidemia:  Has been told total and Triglycerides high in the past.  Was checked with [facility] in years past.  Thinks this was about 1 year ago  3.  Dizzy spells:  First episode was about 1 week ago.  Felt like her head was spinning but did not look like the room was spinning.  No associated nausea or vomiting. Was combing her children's hair.  May have been looking up and down at the time.  Lasted 3 seconds and then gone. Occurred again when out walking that same day.  Again, lasted maybe 4 seconds.  Thought she would fall down. Was having allergy symptoms at the time.   Sneezing, nasal itching, watery and itchy eyes.  No throat symptoms. Takes Zyrtec 10 mg daily as needed. Helped with allergy symptoms.  Ran out and sniffling today.  Current Meds  Medication Sig  . acetaminophen (TYLENOL) 325 MG tablet Take 2 tablets (650 mg total) by mouth every 4 (four) hours as needed (for pain scale < 4).  . ibuprofen (ADVIL,MOTRIN) 600 MG tablet Take 1 tablet (600 mg total) by mouth every 6 (six) hours.  Marland Kitchen levonorgestrel-ethinyl estradiol (KURVELO) 0.15-30 MG-MCG tablet Take 1 tablet by mouth daily.    Allergies  Allergen Reactions  . Kiwi Extract     Throat swells--does not happen all the time  . Soy Allergy Other (See Comments)    Throat swells    Past Medical History:  Diagnosis Date  . Bilateral ovarian cysts   . Chronic sinusitis    . GERD (gastroesophageal reflux disease)   . Hyperlipemia   . Varicose vein     Past Surgical History:  Procedure Laterality Date  . NO PAST SURGERIES      Family History  Problem Relation Age of Onset  . Heart attack Paternal Aunt   . Heart attack Paternal Uncle   . Cancer Maternal Grandmother        liver  . Heart attack Paternal Aunt   . Breast cancer Cousin   . Hyperlipidemia Mother   . Arthritis Brother   At end of visit, brings up sister with history of thyroid disease.  Social History   Socioeconomic History  . Marital status: Married    Spouse name: Rosemarie Ax  . Number of children: 4  . Years of education: 70  . Highest education level: 12th grade  Occupational History  . 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 Financial resource strain: Not very hard  . Food insecurity:    Worry: Never true    Inability: Never true  . Transportation needs:    Medical: No    Non-medical: No  Tobacco Use  . Smoking status: Never Smoker  . Smokeless tobacco: Never Used  Substance and Sexual Activity  . Alcohol use: No  . Drug use: No  . Sexual  activity: Yes    Birth control/protection: Pill  Lifestyle  . Physical activity:    Days per week: 3 days    Minutes per session: 60 min  . Stress: To some extent  Relationships  . Social connections:    Talks on phone: More than three times a week    Gets together: Once a week    Attends religious service: 1 to 4 times per year    Active member of club or organization: No    Attends meetings of clubs or organizations: Never    Relationship status: Married  . Intimate partner violence:    Fear of current or ex partner: No    Emotionally abused: No    Physically abused: No    Forced sexual activity: No  Other Topics Concern  . Not on file  Social History Narrative   Lives at home with husband and 4 children    Review of Systems     Objective:   Physical Exam NAD Sniffling throughout HEENT:   PERRL, EOMI, no injection of conjunctivae, nasal mucosa swollen and boggy with clear discharge, posterior pharynx difficult to visualize. TMs pearly gray Neck:  Supple, No adenopathy, no thyromegaly or mass Chest:  CTA CV:  RRR with normal S1 and S2, No S3, S4 or murmur.  Radial and DP pulses normal and equal Abd:  S, NT, No HSM or mass, + BS LE: varicosities. Neuro:  A & O x 3, CN  II-XII grossly intact, DTRs 2+/4, Motor 5/5, sensory grossly normal. Romberg negative Gait normal No nystagmua      Assessment & Plan:  1.  Decreased libido:  Discussed could change to Sprintec 28 day and see if decrease problem with libido.  She elected to stay on Kurvelo for another month and will let me know. Price is a consideration and currently BCPs without cost to her.  2.  Vertigo:  Episodes short lived and asymptomatic for a week.  To call if recurs and more profound.  Suspect related to #3  3.  Allergies:  Zyrtec 10 mg daily.

## 2017-08-10 ENCOUNTER — Telehealth: Payer: Self-pay | Admitting: Licensed Clinical Social Worker

## 2017-08-10 NOTE — Telephone Encounter (Signed)
LCSW called patient in order to introduce self and provide information about social work services available at the clinic. Pt reported no needs at this time.

## 2017-08-17 ENCOUNTER — Other Ambulatory Visit: Payer: Self-pay

## 2017-08-17 DIAGNOSIS — Z79899 Other long term (current) drug therapy: Secondary | ICD-10-CM

## 2017-08-17 DIAGNOSIS — R5383 Other fatigue: Secondary | ICD-10-CM

## 2017-08-17 DIAGNOSIS — E782 Mixed hyperlipidemia: Secondary | ICD-10-CM

## 2017-08-18 LAB — COMPREHENSIVE METABOLIC PANEL
A/G RATIO: 1.5 (ref 1.2–2.2)
ALBUMIN: 4.2 g/dL (ref 3.5–5.5)
ALT: 14 IU/L (ref 0–32)
AST: 17 IU/L (ref 0–40)
Alkaline Phosphatase: 35 IU/L — ABNORMAL LOW (ref 39–117)
BUN / CREAT RATIO: 18 (ref 9–23)
BUN: 12 mg/dL (ref 6–24)
Bilirubin Total: 0.3 mg/dL (ref 0.0–1.2)
CALCIUM: 9.1 mg/dL (ref 8.7–10.2)
CO2: 24 mmol/L (ref 20–29)
Chloride: 103 mmol/L (ref 96–106)
Creatinine, Ser: 0.67 mg/dL (ref 0.57–1.00)
GFR calc Af Amer: 126 mL/min/{1.73_m2} (ref >59)
GFR, EST NON AFRICAN AMERICAN: 110 mL/min/{1.73_m2} (ref >59)
Globulin, Total: 2.8 g/dL (ref 1.5–4.5)
Glucose: 86 mg/dL (ref 65–99)
POTASSIUM: 4.6 mmol/L (ref 3.5–5.2)
Sodium: 140 mmol/L (ref 134–144)
TOTAL PROTEIN: 7 g/dL (ref 6.0–8.5)

## 2017-08-18 LAB — CBC WITH DIFFERENTIAL/PLATELET
BASOS: 0 %
Basophils Absolute: 0 10*3/uL (ref 0.0–0.2)
EOS (ABSOLUTE): 0.2 10*3/uL (ref 0.0–0.4)
EOS: 2 %
HEMOGLOBIN: 13.5 g/dL (ref 11.1–15.9)
Hematocrit: 42.1 % (ref 34.0–46.6)
IMMATURE GRANS (ABS): 0 10*3/uL (ref 0.0–0.1)
IMMATURE GRANULOCYTES: 0 %
LYMPHS: 33 %
Lymphocytes Absolute: 2.4 10*3/uL (ref 0.7–3.1)
MCH: 29.7 pg (ref 26.6–33.0)
MCHC: 32.1 g/dL (ref 31.5–35.7)
MCV: 93 fL (ref 79–97)
MONOCYTES: 3 %
Monocytes Absolute: 0.3 10*3/uL (ref 0.1–0.9)
NEUTROS PCT: 62 %
Neutrophils Absolute: 4.5 10*3/uL (ref 1.4–7.0)
PLATELETS: 275 10*3/uL (ref 150–450)
RBC: 4.55 x10E6/uL (ref 3.77–5.28)
RDW: 14 % (ref 12.3–15.4)
WBC: 7.3 10*3/uL (ref 3.4–10.8)

## 2017-08-18 LAB — LIPID PANEL W/O CHOL/HDL RATIO
Cholesterol, Total: 176 mg/dL (ref 100–199)
HDL: 30 mg/dL — ABNORMAL LOW (ref >39)
LDL CALC: 115 mg/dL — AB (ref 0–99)
TRIGLYCERIDES: 155 mg/dL — AB (ref 0–149)
VLDL Cholesterol Cal: 31 mg/dL (ref 5–40)

## 2017-08-18 LAB — TSH: TSH: 1.18 u[IU]/mL (ref 0.450–4.500)

## 2017-12-31 ENCOUNTER — Other Ambulatory Visit (HOSPITAL_COMMUNITY): Payer: Self-pay | Admitting: Obstetrics and Gynecology

## 2017-12-31 ENCOUNTER — Other Ambulatory Visit (HOSPITAL_COMMUNITY): Payer: Self-pay | Admitting: *Deleted

## 2017-12-31 DIAGNOSIS — R2232 Localized swelling, mass and lump, left upper limb: Secondary | ICD-10-CM

## 2017-12-31 DIAGNOSIS — R2231 Localized swelling, mass and lump, right upper limb: Secondary | ICD-10-CM

## 2018-01-03 ENCOUNTER — Ambulatory Visit
Admission: RE | Admit: 2018-01-03 | Discharge: 2018-01-03 | Disposition: A | Discharge status: Home / Self Care | Payer: No Typology Code available for payment source | Source: Ambulatory Visit | Attending: Obstetrics and Gynecology | Admitting: Obstetrics and Gynecology

## 2018-01-03 ENCOUNTER — Encounter (HOSPITAL_COMMUNITY): Payer: Self-pay

## 2018-01-03 ENCOUNTER — Ambulatory Visit (HOSPITAL_COMMUNITY)
Admission: RE | Admit: 2018-01-03 | Discharge: 2018-01-03 | Disposition: A | Discharge status: Home / Self Care | Payer: Self-pay | Source: Ambulatory Visit | Attending: Obstetrics and Gynecology | Admitting: Obstetrics and Gynecology

## 2018-01-03 ENCOUNTER — Other Ambulatory Visit: Payer: No Typology Code available for payment source

## 2018-01-03 VITALS — BP 120/78 | Wt 145.8 lb

## 2018-01-03 DIAGNOSIS — R2231 Localized swelling, mass and lump, right upper limb: Secondary | ICD-10-CM

## 2018-01-03 DIAGNOSIS — Z1239 Encounter for other screening for malignant neoplasm of breast: Secondary | ICD-10-CM

## 2018-01-03 NOTE — Progress Notes (Signed)
Complaints of two right axillary lumps x one week that is painful. Patient states the pain comes and goes. Patient rates the pain at a 8 out of 10.  Pap Smear: Pap smear not completed today. Last Pap smear was 04/10/2017 at [facility] and normal with negative HPV. Per patient has no history of an abnormal Pap smear. Last two Pap smear results are in Epic.  Physical exam: Breasts Breasts symmetrical. Bilateral axillary redness observed on exam. No nipple retraction bilateral breasts. No nipple discharge bilateral breasts. No lymphadenopathy. No lumps palpated left breast. Palpated two lumps within the right axilla at 10:30 o'clock 8 and 9 cm from the nipple. Complaints of right axillary tenderness on exam. Referred patient to the [facility] for a right breast diagnostic mammogram and ultrasound Appointment scheduled for Thursday, January 03, 2018 at 0930.        Pelvic/Bimanual No Pap smear completed today since last Pap smear and HPV typing was 04/10/2017. Pap smear not indicated per [facility] guidelines.   Smoking History: Patient has never smoked.  Patient Navigation: Patient education provided. Access to services provided for patient through [facility] program. Spanish interpreter provided.   Breast and Cervical Cancer Risk Assessment: Patient has no family history of breast cancer, known genetic mutations, or radiation treatment to the chest before age 26. Patient has no history of cervical dysplasia, immunocompromised, or DES exposure in-utero.  Risk Assessment    Risk Scores      01/03/2018   Last edited by: Lynnell Dike, LPN   5-year risk: 0.5 %   Lifetime risk: 8.5 %         Used Spanish interpreter Leana Roe from [facility].

## 2018-01-03 NOTE — Patient Instructions (Signed)
Explained breast self awareness with [name]. Patient did not need a Pap smear today due to last Pap smear and HPV typing was 04/10/2017. Let her know [facility] will cover Pap smears and HPV typing every 5 years unless has a history of abnormal Pap smears. Referred patient to the [facility] for a right breast diagnostic mammogram and ultrasound Appointment scheduled for Thursday, January 03, 2018 at 0930. Denise Ruiz verbalized understanding.  Lecil Tapp, Kathaleen Maser, RN 1:15 PM

## 2018-04-15 ENCOUNTER — Other Ambulatory Visit (HOSPITAL_COMMUNITY): Payer: Self-pay | Admitting: *Deleted

## 2018-04-15 DIAGNOSIS — Z1231 Encounter for screening mammogram for malignant neoplasm of breast: Secondary | ICD-10-CM

## 2018-05-01 ENCOUNTER — Other Ambulatory Visit: Payer: Self-pay | Admitting: *Deleted

## 2018-05-01 DIAGNOSIS — Z0000 Encounter for general adult medical examination without abnormal findings: Secondary | ICD-10-CM

## 2018-05-03 ENCOUNTER — Inpatient Hospital Stay: Payer: Self-pay

## 2018-05-03 ENCOUNTER — Inpatient Hospital Stay: Payer: Self-pay | Attending: Obstetrics and Gynecology

## 2018-05-03 DIAGNOSIS — Z0000 Encounter for general adult medical examination without abnormal findings: Secondary | ICD-10-CM

## 2018-05-03 LAB — LIPID PANEL
Cholesterol: 195 mg/dL (ref 0–200)
HDL: 33 mg/dL — AB (ref >40)
LDL CALC: 141 mg/dL — AB (ref 0–99)
TRIGLYCERIDES: 104 mg/dL (ref <150)
Total CHOL/HDL Ratio: 5.9 RATIO
VLDL: 21 mg/dL (ref 0–40)

## 2018-05-03 LAB — HEMOGLOBIN A1C
HEMOGLOBIN A1C: 5.3 % (ref 4.8–5.6)
Mean Plasma Glucose: 105.41 mg/dL

## 2018-05-03 NOTE — Addendum Note (Signed)
Addended by: Deforest Hoyles D on: 05/03/2018 09:39 AM   Modules accepted: Orders

## 2018-05-03 NOTE — Progress Notes (Unsigned)
[facility] initial screening  Spanish interpreter- Marlene Yemen, [place] contracted interpreter  Clinical Measurement:  Height: 60in Weight: 143lb  Blood Pressure: 124/70  Blood Pressure #2: 118/68 Fasting Labs Drawn Today, will review with patient when they result.  Medical History:  Patient states that she has not been diagnosed with high cholesterol, high blood pressure, diabetes or heart disease.  Medications:  Patients states she is not taking any medications for high cholesterol, high blood pressure or diabetes.  She is not taking aspirin daily to prevent heart attack or stroke.    Blood pressure, self measurement:  Patients states she does not measure blood pressure at home.    Nutrition:  Patient states she eats 2 cups of fruit and 2 cups of vegetables in an average day.  Patient states she does  eat fish regularly, she eats less than half a serving of whole grains daily. She drinks less than 36 ounces of beverages with added sugar weekly.  She is currently watching her sodium intake.  She has not had any drinks containing alcohol in the last seven days.    Physical activity:  Patient states that she gets 140 minutes of moderate exercise in a week.  She gets 60 minutes of vigorous exercise per week.    Smoking status:  Patient states she has never smoked and is not around any smokers.    Quality of life:  Patient states that she has had 0 bad physical days out of the last 30 days. In the last 2 weeks, she has had 0 days that she has felt down or depressed. She has had 0 days in the last 2 weeks that she has had little interest or pleasure in doing things.  Risk reduction and counseling:  Patient states she wants to lose weight and increase her exercise regimen.  I encouraged her to increase her  current exercise regimen and increase vegetable and fruit intake.  Navigation:  I will notify patient of lab results.  Patient is aware of 2 more health coaching sessions and a follow up.

## 2018-05-08 ENCOUNTER — Telehealth: Payer: Self-pay | Admitting: *Deleted

## 2018-05-08 NOTE — Telephone Encounter (Signed)
Health coaching 2   Spanish interpreter-Julie Sowell  Labs- HDL cholesterol 33, LDL cholesterol 141, cholesterol 195, triglycerides 104, hemoglobin A1C  5.3,mean plasma glucose 105.41  Patient is aware and understands these lab results.  Goals- Patient states that she intends to increase the amount she exercises.  I encouraged patient to try to exercise at least 30 minutes daily or 150 minutes weekly.  Patient states that she intends to eat more heart healthy fish, like tuna and salmon.  I encouraged patient to try to eat fish at least twice weekly. I also encouraged patient to eat more lean meats like Mal[place]key and chicken.  Navigation:  Patient is aware of 1 more health coaching sessions and an in person  follow up.  Time-10 minutes

## 2018-05-17 ENCOUNTER — Telehealth: Payer: Self-pay | Admitting: *Deleted

## 2018-05-17 NOTE — Telephone Encounter (Signed)
Health coaching 3   Spanish interpeter- Natale Lay  Goals-Patient states that she is exercising 20 minutes per day.  Patient states that she is eating almonds and avocados as a healthy snack.  I encouraged patient to continue with her health regimen and to include more lean meats and heart healthy fish as a part of her regimen.  Navigation:  Patient is aware of an in person  follow up.   Time- 10 minutes

## 2018-05-17 NOTE — Telephone Encounter (Signed)
Via Spanish interpreter, Erika McReynolds, telephoned patient, left a message to return a call to [facility] regarding health coaching.   

## 2018-05-28 ENCOUNTER — Ambulatory Visit: Payer: Self-pay | Admitting: Internal Medicine

## 2018-05-31 ENCOUNTER — Encounter: Payer: Self-pay | Admitting: Internal Medicine

## 2018-05-31 ENCOUNTER — Ambulatory Visit: Payer: Self-pay | Admitting: Internal Medicine

## 2018-05-31 VITALS — BP 122/80 | HR 68 | Resp 12 | Ht <= 58 in | Wt 140.0 lb

## 2018-05-31 DIAGNOSIS — M549 Dorsalgia, unspecified: Secondary | ICD-10-CM

## 2018-05-31 MED ORDER — IBUPROFEN 200 MG PO TABS: ORAL_TABLET | ORAL | 0 refills | Status: DC

## 2018-05-31 NOTE — Progress Notes (Signed)
    Subjective:    Patient ID: Denise Ruiz, female   DOB: 09/01/1975, 43 y.o.   MRN: [id number]   HPI   History of right shoulder pain for 20 years.  No history of injury she can recall.  Thinks it started during her high school volleyball years.   In past 4 months, developed increasing pain in upper back on the right and posterior shoulder.   Does not recall any activity or injury that set this off.   Has not tried any medication for the pain OTC.   Has taken Tylenol and Ibuprofen for other reasons.  Cannot say if helped the shoulder.   Did apply warm compresses with some help.  Denies numbness or tingling of her right arm, but does have some discomfort down her right arm and her right arm feels heavy at times.    Current Meds  Medication Sig  . acetaminophen (TYLENOL) 325 MG tablet Take 2 tablets (650 mg total) by mouth every 4 (four) hours as needed (for pain scale < 4).  . GARLIC 1500 PO Take by mouth daily.  Marland Kitchen ibuprofen (ADVIL,MOTRIN) 600 MG tablet Take 1 tablet (600 mg total) by mouth every 6 (six) hours.  . Omega-3 Fatty Acids (FISH OIL PO) Take by mouth daily.   Allergies  Allergen Reactions  . Kiwi Extract     Throat swells--does not happen all the time  . Soy Allergy Other (See Comments)    Throat swells     Review of Systems    Objective:   BP 122/80 (BP Location: Left Arm, Patient Position: Sitting, Cuff Size: Normal)   Pulse 68   Resp 12   Ht 4\' 10"  (1.473 m)   Wt 140 lb (63.5 kg)   LMP 05/09/2018   BMI 29.26 kg/m   Physical Exam   NAD MS: full ROM of Right shoulder.  Mild tenderness over C7 spinous process.  Tender musculature medial to right scapula. NT over AC/CC/clavicle/subacromial bursa.  Assessment & Plan   Right upper thoracic back pain:  ROM exercises.   PT referral. Ibuprofen as needed with food. Xrays if no improvement.

## 2018-07-19 ENCOUNTER — Ambulatory Visit: Payer: Self-pay | Admitting: Internal Medicine

## 2018-07-19 ENCOUNTER — Telehealth (INDEPENDENT_AMBULATORY_CARE_PROVIDER_SITE_OTHER): Payer: Self-pay | Admitting: Internal Medicine

## 2018-07-19 ENCOUNTER — Other Ambulatory Visit: Payer: Self-pay

## 2018-07-19 DIAGNOSIS — L03111 Cellulitis of right axilla: Secondary | ICD-10-CM | POA: Insufficient documentation

## 2018-07-19 MED ORDER — DOXYCYCLINE HYCLATE 100 MG PO TABS: ORAL_TABLET | ORAL | 0 refills | Status: DC

## 2018-07-19 NOTE — Progress Notes (Signed)
    Subjective:    Patient ID: Denise Ruiz, female   DOB: 03/31/1975, 43 y.o.   MRN: [id number]   HPI   Boil under right axilla for 3 days, appears to be coming to a head. Has applied hot compresses. She has not tried to squeeze.   Did apply a paste of baking soda.  Has helped with the burning discomfort.   No fever.   Has had swellings like this before in both axilla   Current Meds  Medication Sig  . cetirizine (ZYRTEC ALLERGY) 10 MG tablet Take 1 tablet (10 mg total) by mouth daily.  . Omega-3 Fatty Acids (FISH OIL PO) Take by mouth daily.   Allergies  Allergen Reactions  . Kiwi Extract     Throat swells--does not happen all the time  . Soy Allergy Other (See Comments)    Throat swells     Review of Systems    Objective:   There were no vitals taken for this visit.  Physical Exam   right axilla with elliptical swelling with erythema running horizontally within skin fold of axilla.  No obvious head formation. Old elliptical scar along inner upper arm at margin of axilla.   Assessment & Plan   Right axillary skin infection:  Unable to ascertain virtually if fluctuant.   To continue with warm compresses for 20 minutes at least twice daily. No shaving in area. Doxycycline 100 mg twice daily for 10 days. Call clinic if worsening or if no improvement in next 48 hours.

## 2018-07-23 ENCOUNTER — Ambulatory Visit (HOSPITAL_COMMUNITY): Payer: No Typology Code available for payment source

## 2018-09-06 ENCOUNTER — Other Ambulatory Visit: Payer: Self-pay

## 2018-09-06 ENCOUNTER — Ambulatory Visit
Admission: RE | Admit: 2018-09-06 | Discharge: 2018-09-06 | Disposition: A | Discharge status: Home / Self Care | Payer: No Typology Code available for payment source | Source: Ambulatory Visit | Attending: Obstetrics and Gynecology | Admitting: Obstetrics and Gynecology

## 2018-09-06 DIAGNOSIS — Z1231 Encounter for screening mammogram for malignant neoplasm of breast: Secondary | ICD-10-CM

## 2018-09-11 ENCOUNTER — Ambulatory Visit: Payer: No Typology Code available for payment source

## 2018-09-19 ENCOUNTER — Encounter: Payer: Self-pay | Admitting: Internal Medicine

## 2018-09-19 ENCOUNTER — Ambulatory Visit (INDEPENDENT_AMBULATORY_CARE_PROVIDER_SITE_OTHER): Payer: Self-pay | Admitting: Internal Medicine

## 2018-09-19 ENCOUNTER — Other Ambulatory Visit: Payer: Self-pay

## 2018-09-19 VITALS — BP 110/80 | HR 80 | Resp 12 | Ht <= 58 in | Wt 145.0 lb

## 2018-09-19 DIAGNOSIS — E785 Hyperlipidemia, unspecified: Secondary | ICD-10-CM

## 2018-09-19 DIAGNOSIS — L732 Hidradenitis suppurativa: Secondary | ICD-10-CM

## 2018-09-19 DIAGNOSIS — M79672 Pain in left foot: Secondary | ICD-10-CM

## 2018-09-19 DIAGNOSIS — E669 Obesity, unspecified: Secondary | ICD-10-CM

## 2018-09-19 DIAGNOSIS — Z6830 Body mass index (BMI) 30.0-30.9, adult: Secondary | ICD-10-CM

## 2018-09-19 MED ORDER — CLINDAMYCIN PHOSPHATE 1 % EX SOLN: Freq: Two times a day (BID) | CUTANEOUS | 6 refills | Status: DC

## 2018-09-19 NOTE — Progress Notes (Signed)
    Subjective:    Patient ID: Denise Ruiz, female   DOB: Aug 10, 1975, 43 y.o.   MRN: [id number]   HPI   1.  Sebaceous Cysts in right axilla:  Keeps getting red, swollen and at times drain pus at times.  Today just a bit inflamed.   Was noted last October on axillary ultrasound following her mammogram and felt to be a collection of sebaceous cysts.   She states she has occasionally a similar problem on the left. Stopped using anti-perspirant.    2.  Left 3rd and 4th toes have been burning for past 3-4 months.  No numbness.  Extends back to the ball of foot in the same ray distribution.  Has had problems in past in shoes that have a higher heel.     3.  Dyslipidemia:  LDL rising and HDL low.  Total cholesterol also rising.  Lipid Panel     Component Value Date/Time   CHOL 195 05/03/2018 0941   CHOL 176 08/17/2017 0919   TRIG 104 05/03/2018 0941   HDL 33 (L) 05/03/2018 0941   HDL 30 (L) 08/17/2017 0919   CHOLHDL 5.9 05/03/2018 0941   VLDL 21 05/03/2018 0941   LDLCALC 141 (H) 05/03/2018 0941   LDLCALC 115 (H) 08/17/2017 0919    Current Meds  Medication Sig  . cetirizine (ZYRTEC ALLERGY) 10 MG tablet Take 1 tablet (10 mg total) by mouth daily.  . Omega-3 Fatty Acids (FISH OIL PO) Take by mouth daily.   Allergies  Allergen Reactions  . Kiwi Extract     Throat swells--does not happen all the time  . Soy Allergy Other (See Comments)    Throat swells     Review of Systems    Objective:   BP 110/80 (BP Location: Left Arm, Patient Position: Sitting, Cuff Size: Normal)   Pulse 80   Resp 12   Ht 4\' 10"  (1.473 m)   Wt 145 lb (65.8 kg)   LMP 08/14/2018   BMI 30.31 kg/m   Physical Exam  NAD Lungs:  CTA CV:  RRR without murmur or rub.  Radial and pulses normal and equal Axillae with thickening of folds and chronic looking inflammation/pinkening of thick folds.  No pits noted. Left foot:  No pain with compression of MT heads and pressure over plantar aspect  of foot between rays of 3rd and 4th digits.     Assessment & Plan   1.  Hidradenitis suppurativa of axillae bilaterally:  Discussed washing with Chlorhexadine twice daily and then applying Clindamycin topically. She does not use anything more than condoms for birth control. Will need to discuss further should we need to progress to Doxycycline as a suppressant. Metformin also a consideration, particularly with weight and cholesterol panel. Her A1C recently in normal range.  2.  Dyslipidemia:  To work on learning how to ride a bike to prevent further compression to nerve of left foot rather than walking. Also to work on diet. FLP in 3 months with weight check.  3.  Obesity:  As above  4.  Compression of nerve, left foot:  No heels.  Cushion for shoes.

## 2018-09-19 NOTE — Patient Instructions (Addendum)
clorhexadine to wash axilla twice daily before applying Clindamycin topically  Do not wear shoes with any heel elevation. Dr. Felicie Morn gel inserts are okay.

## 2018-10-08 ENCOUNTER — Telehealth: Payer: Self-pay

## 2018-10-08 NOTE — Telephone Encounter (Signed)
Spoke with patient to confirm [facility] appointment on 10/09/2018.

## 2018-10-09 ENCOUNTER — Other Ambulatory Visit: Payer: Self-pay

## 2018-10-09 ENCOUNTER — Inpatient Hospital Stay: Payer: Self-pay | Attending: Obstetrics and Gynecology | Admitting: *Deleted

## 2018-10-09 VITALS — BP 110/72 | Temp 97.7°F | Ht 60.0 in | Wt 146.0 lb

## 2018-10-09 DIAGNOSIS — Z0000 Encounter for general adult medical examination without abnormal findings: Secondary | ICD-10-CM

## 2018-10-09 NOTE — Progress Notes (Signed)
Wisewoman follow up   Interpreter: Rudene Anda, [employer]  Clinical Measurement:  Height: 60 in Weight: 146 lb  Blood Pressure: 110/72  Blood Pressure #2: 112/72    Medical History:  Patient states that she does not have a history of high cholesterol, blood pressure or diabetes.    Medications:  Patient states that she does not take medication to lower cholesterol, blood pressure or blood sugar. Patient does not take an aspirin a day to prevent heart attack or stroke.   Blood pressure, self measurement:  Patient states that she does not measure blood pressure and has never been told to measure blood pressure.  Nutrition: Patient states that on average she eats 2 cups of fruit and 3 cups of vegetables on an average day. Patient states that she does not eat fish at least 2 times a week. Less than half of servings of grain products are whole grains. Patient drinks less than 36 ounces of beverages with added sugars weekly. Patient is currently watching sodium and salt intake. Patient states that in the past 7 days she has had 0 days where she has drank a drink with alcohol. On a day when the patient consumes a drink with alcohol she drinks on average 1 drink.    Physical activity:  Patient states she gets 225 minutes of moderate physical activity in a week and 100 minutes of vigorous physical activity in a week.   Smoking status:  Patient states that she has never smoked tobacco.    Quality of life:  Over the past 2 weeks patient has had no days where she has had little interest or pleasure in doing things or feeling down, depressed or hopeless.   Health Coaching: Encouraged patient to continue eating 2 servings of fruits and 3 servings of vegetables. Spoke with patient about trying to incorporate heart healthy fish into diet. Suggested that patient try salmon, tuna or mackerel. Also spoke with patient about adding whole grains in diet. Patient is not currently consuming any whole grains. Gave  suggestions to try substituting brown rice instead of white, trying whole wheat tortillas and whole wheat bread.    Navigation: This was the  follow up session for this patient, I will check up on her progress in the coming months.  Time: 20 minutes

## 2018-12-16 ENCOUNTER — Other Ambulatory Visit: Payer: Self-pay

## 2018-12-16 ENCOUNTER — Other Ambulatory Visit (INDEPENDENT_AMBULATORY_CARE_PROVIDER_SITE_OTHER): Payer: Self-pay

## 2018-12-16 DIAGNOSIS — E785 Hyperlipidemia, unspecified: Secondary | ICD-10-CM

## 2018-12-17 LAB — LIPID PANEL W/O CHOL/HDL RATIO
Cholesterol, Total: 179 mg/dL (ref 100–199)
HDL: 27 mg/dL — ABNORMAL LOW (ref >39)
LDL Chol Calc (NIH): 98 mg/dL (ref 0–99)
Triglycerides: 322 mg/dL — ABNORMAL HIGH (ref 0–149)
VLDL Cholesterol Cal: 54 mg/dL — ABNORMAL HIGH (ref 5–40)

## 2018-12-19 ENCOUNTER — Ambulatory Visit: Payer: Self-pay | Admitting: Internal Medicine

## 2018-12-19 ENCOUNTER — Encounter: Payer: Self-pay | Admitting: Internal Medicine

## 2018-12-19 ENCOUNTER — Other Ambulatory Visit: Payer: Self-pay | Admitting: Internal Medicine

## 2018-12-19 ENCOUNTER — Other Ambulatory Visit: Payer: Self-pay

## 2018-12-19 VITALS — BP 118/78 | HR 66 | Resp 12 | Ht <= 58 in | Wt 148.0 lb

## 2018-12-19 DIAGNOSIS — J3089 Other allergic rhinitis: Secondary | ICD-10-CM | POA: Insufficient documentation

## 2018-12-19 DIAGNOSIS — E782 Mixed hyperlipidemia: Secondary | ICD-10-CM

## 2018-12-19 DIAGNOSIS — R739 Hyperglycemia, unspecified: Secondary | ICD-10-CM

## 2018-12-19 LAB — GLUCOSE, POCT (MANUAL RESULT ENTRY): POC Glucose: 114 mg/dl — AB (ref 70–99)

## 2018-12-19 NOTE — Progress Notes (Signed)
    Subjective:    Patient ID: Denise Ruiz, female   DOB: Sep 09, 1975, 43 y.o.   MRN: [id number]   HPI   1.  Hyperlipidemia:  HDL down further and triglycerides up significantly.  LDL down substantially and total improved as well.   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  Lipid Panel     Component Value Date/Time   CHOL 179 12/16/2018 0911   TRIG 322 (H) 12/16/2018 0911   HDL 27 (L) 12/16/2018 0911   CHOLHDL 5.9 05/03/2018 0941   VLDL 21 05/03/2018 0941   LDLCALC 141 (H) 05/03/2018 0941   LDLCALC 115 (H) 08/17/2017 0919  LDL 12/16/2018:  98--not sure why not showing.  2.  Elevated fasting blood sugar:  Asked to have checked today--she just wanted checked.   Describes fairly healthy diet with lots of veggies.  No soda or juice.    3.  Allergies:  Need Zyrtec refilled  Current Meds  Medication Sig  . cetirizine (ZYRTEC ALLERGY) 10 MG tablet Take 1 tablet (10 mg total) by mouth daily.  . clindamycin (CLEOCIN-T) 1 % external solution Apply topically 2 (two) times daily.   Allergies  Allergen Reactions  . Kiwi Extract     Throat swells--does not happen all the time  . Soy Allergy Other (See Comments)    Throat swells     Review of Systems    Objective:   BP 118/78 (BP Location: Left Arm, Patient Position: Sitting, Cuff Size: Normal)   Pulse 66   Resp 12   Ht 4\' 10"  (1.473 m)   Wt 148 lb (67.1 kg)   LMP 12/03/2018   BMI 30.93 kg/m   Physical Exam  NAD Lungs:  CTA CV:  RRR without murmur or rub.  Radial and DP pulses normal and equal Abd:  S, NT, No HSM or mass, + BS LE:  No edema.   Assessment & Plan   1.  Hyperlipidemia:  While total and LDL down substantially, Trigs up x 3 and HDL lower.  Discussed diet and getting back to regular physical activity.  2.  Elevated fasting blood glucose:  A1C.  As above.  3.  Allergies:  Refill Zyrtec   HM:  Flu vaccine  clinic in October recommended.

## 2018-12-19 NOTE — Patient Instructions (Signed)

## 2018-12-20 LAB — HGB A1C W/O EAG: Hgb A1c MFr Bld: 5.5 % (ref 4.8–5.6)

## 2019-02-06 ENCOUNTER — Other Ambulatory Visit: Payer: Self-pay

## 2019-02-06 ENCOUNTER — Emergency Department (HOSPITAL_COMMUNITY)
Admission: EM | Admit: 2019-02-06 | Discharge: 2019-02-07 | Disposition: A | Discharge status: Home / Self Care | Payer: No Typology Code available for payment source | Attending: Emergency Medicine | Admitting: Emergency Medicine

## 2019-02-06 DIAGNOSIS — Z5321 Procedure and treatment not carried out due to patient leaving prior to being seen by health care provider: Secondary | ICD-10-CM | POA: Insufficient documentation

## 2019-02-06 NOTE — ED Triage Notes (Signed)
Pt reports she started experiencing the sensation that her throat is tightening.  She also reports a tingling sensation all over her body that "comes and goes".  Pt's husband tested positive for COVID however she tested negative.  Pt is able to speak in complete sentences, no trouble swallowing or breathing. Pt reports her arms are heavy, no neuro deficits.

## 2019-02-07 NOTE — ED Notes (Signed)
Pt did not answer to reassess vital signs

## 2019-02-07 NOTE — ED Notes (Signed)
Called X3 for VS. No answer.

## 2019-04-03 ENCOUNTER — Encounter: Payer: Self-pay | Admitting: Internal Medicine

## 2019-04-03 ENCOUNTER — Other Ambulatory Visit: Payer: Self-pay

## 2019-04-03 ENCOUNTER — Ambulatory Visit (INDEPENDENT_AMBULATORY_CARE_PROVIDER_SITE_OTHER): Payer: Self-pay | Admitting: Internal Medicine

## 2019-04-03 VITALS — BP 118/78 | HR 68 | Resp 12 | Ht 59.0 in | Wt 150.0 lb

## 2019-04-03 DIAGNOSIS — E785 Hyperlipidemia, unspecified: Secondary | ICD-10-CM | POA: Insufficient documentation

## 2019-04-03 DIAGNOSIS — L732 Hidradenitis suppurativa: Secondary | ICD-10-CM | POA: Insufficient documentation

## 2019-04-03 DIAGNOSIS — H524 Presbyopia: Secondary | ICD-10-CM

## 2019-04-03 DIAGNOSIS — R0789 Other chest pain: Secondary | ICD-10-CM

## 2019-04-03 DIAGNOSIS — B373 Candidiasis of vulva and vagina: Secondary | ICD-10-CM

## 2019-04-03 DIAGNOSIS — Z0000 Encounter for general adult medical examination without abnormal findings: Secondary | ICD-10-CM

## 2019-04-03 DIAGNOSIS — Z6830 Body mass index (BMI) 30.0-30.9, adult: Secondary | ICD-10-CM

## 2019-04-03 DIAGNOSIS — E669 Obesity, unspecified: Secondary | ICD-10-CM | POA: Insufficient documentation

## 2019-04-03 DIAGNOSIS — B3731 Acute candidiasis of vulva and vagina: Secondary | ICD-10-CM

## 2019-04-03 LAB — POCT WET PREP WITH KOH
KOH Prep POC: NEGATIVE
RBC Wet Prep HPF POC: NEGATIVE
Trichomonas, UA: NEGATIVE

## 2019-04-03 MED ORDER — FLUCONAZOLE 150 MG PO TABS: 150.0000 mg | ORAL_TABLET | Freq: Once | ORAL | 0 refills | Status: AC

## 2019-04-03 NOTE — Progress Notes (Signed)
Subjective:    Patient ID: Denise Ruiz, female   DOB: 1975-05-06, 44 y.o.   MRN: [id number]   HPI   CPE without pap  1.  Pap:  Last pap 04/10/2017 and normal  2.  Mammogram:  Last mammogram was 09/06/2018.  Family history of paternal first cousins with breast cancer.  Three in total.  All sisters with same parents. One died at age 61 yo, another died at age 15, the third is currently  72, likely diagnosed around age 41.  3.  Osteoprevention:  Drinks maybe 1 glass almond milk daily.  Little in way of yogurt and cheese.  She is willing to drink 3 cups daily of almond milk, unsweetened.  She walks 3 days weekly for about 1/2 hour.  Willing to increase gradually to 1 hour.    4.  Guaiac Cards:  Never.   5.  Colonoscopy:  Never.  No family history of colon cancer  6.  Immunizations:   Immunization History  Administered Date(s) Administered  . Influenza,inj,Quad PF,6+ Mos 12/11/2014, 01/09/2019  . Influenza-Unspecified 01/09/2019  . Tdap 12/11/2014     7.  Glucose/Cholesterol:  In September, her A1C was up to 5.5%--checked due to elevated blood sugar.  Cholesterol was increasing with dyslipidemia as well. She has not really worked on diet and physical activity as she states the weather is cold and the holidays. Weight is up 10 lbs from March 2020.  Lipid Panel     Component Value Date/Time   CHOL 179 12/16/2018 0911   TRIG 322 (H) 12/16/2018 0911   HDL 27 (L) 12/16/2018 0911   CHOLHDL 5.9 05/03/2018 0941   VLDL 21 05/03/2018 0941   LDLCALC 98 12/16/2018 0911   LABVLDL 54 (H) 12/16/2018 0911     Current Meds  Medication Sig  . cetirizine (ZYRTEC ALLERGY) 10 MG tablet Take 1 tablet (10 mg total) by mouth daily.  . clindamycin (CLEOCIN-T) 1 % external solution Apply topically 2 (two) times daily.  Marland Kitchen GARLIC 1500 PO Take by mouth. 1 daily  . Multiple Vitamin (MULTIVITAMIN) tablet Take 1 tablet by mouth daily.   Allergies  Allergen Reactions  . Kiwi Extract    Throat swells--does not happen all the time  . Soy Allergy Other (See Comments)    Throat swells     Review of Systems  HENT: Negative for dental problem (wants teeth cleaned.).   Eyes: Positive for visual disturbance (describes holding reading material away from face to focus).  Respiratory: Negative for shortness of breath.   Cardiovascular: Positive for chest pain (See musculoskeletal.). Negative for palpitations and leg swelling.  Gastrointestinal: Negative for abdominal pain and blood in stool (No melena or hematochezia).  Genitourinary: Positive for pelvic pain (sometimes when stands up suddenly, has pain in right pelvic--similar to ovarian cyst pain in past.) and vaginal discharge (white discharge about 1 week ago.  Resolved, but still with a bit of itching.).  Musculoskeletal:       Pain in left chest and breast pain.  Sometimes radiates to back.  Generally notes this with lying down.   Does not note with physical activity really. If does have with activity, she does not pay attention.   May be more frequent after drinking coffee. Better when does not drink coffee.  Neurological: Negative for weakness and light-headedness.  Psychiatric/Behavioral: Negative for dysphoric mood. The patient is not nervous/anxious.       Objective:   BP 118/78 (BP Location: Left Leg,  Patient Position: Sitting, Cuff Size: Normal)   Pulse 68   Resp 12   Ht 4\' 11"  (1.499 m)   Wt 150 lb (68 kg)   LMP 03/08/2019   BMI 30.30 kg/m   Physical Exam  Constitutional: She is oriented to person, place, and time. She appears well-developed and well-nourished.  HENT:  Head: Normocephalic and atraumatic.  Right Ear: Tympanic membrane, external ear and ear canal normal.  Left Ear: Tympanic membrane, external ear and ear canal normal.  Nose: Nose normal.  Mouth/Throat: Uvula is midline, oropharynx is clear and moist and mucous membranes are normal.  Eyes: Pupils are equal, round, and reactive to light.  Conjunctivae and EOM are normal.  Neck: Thyromegaly (generous on left-short neck makes this difficult to feel.) present.  Cardiovascular: Normal rate, regular rhythm, S1 normal and S2 normal. Exam reveals no S3, no S4 and no friction rub.  No murmur heard. No carotid bruits.  Carotid, radial, femoral, DP and PT pulses normal and equal.   Pulmonary/Chest: Effort normal and breath sounds normal. Right breast exhibits no inverted nipple, no mass, no nipple discharge and no tenderness. Left breast exhibits no inverted nipple, no mass, no skin change and no tenderness.  Abdominal: Soft. Bowel sounds are normal. She exhibits no mass. There is no hepatosplenomegaly. There is no abdominal tenderness. No hernia.  Genitourinary: Rectum:     External hemorrhoid present.     Genitourinary Comments: Normal externa female genitalia with scant thick white discharge.  No inflammation. Cervix with superior aspect somewhat deformed--almost peaked or flanged out at 12 O clock.  No inflammatory changes. No CMT. Uterus somewhat anteverted.  No uterine or adnexal mass or tenderness.     Musculoskeletal:        General: Normal range of motion.     Cervical back: Full passive range of motion without pain, normal range of motion and neck supple.     Comments: Tender on palpation of left pectoral muscle and left latissimus/teres muscle group--reproduces pain.  Lymphadenopathy:       Head (right side): No submental and no submandibular adenopathy present.       Head (left side): No submental and no submandibular adenopathy present.    She has no cervical adenopathy.    She has no axillary adenopathy.       Right: No inguinal and no supraclavicular adenopathy present.       Left: No inguinal and no supraclavicular adenopathy present.  Neurological: She is alert and oriented to person, place, and time. She has normal strength and normal reflexes. No cranial nerve deficit or sensory deficit. Coordination and gait normal.   Skin: Skin is warm. No lesion and no rash noted.  Psychiatric: She has a normal mood and affect. Her speech is normal and behavior is normal. Judgment and thought content normal. Cognition and memory are normal.     Assessment & Plan   1.  CPE without pap Guaiac cards x 3 to return in 2 weeks. Immunizations up to date. Recommend COVID19 vaccine when available.  2.  Likely Presbyopia:  [employer] and to check out reading glasses at pharmacy to find what strength works best for her.  3.  Dental:  No orange card, so cannot refer to Dental.  Recommend [employer] [facility] [employer] for cleaning sooner--flyer given  4.  Possible mild thyromegaly--more pronounced on left.  Neck short so makes this exam somewhat difficulty.  Check TSH with next fasting labs.  5.  Obesity/Dyslipidemia/hyperglycemia:  Diet and physical activity goals discussed.  6.  Mild yeast vaginitis:  Fluconazole 150 mg once  FAsting labs in 3 months with OV a couple days later and weight check.

## 2019-04-03 NOTE — Patient Instructions (Signed)

## 2019-04-17 ENCOUNTER — Other Ambulatory Visit: Payer: Self-pay

## 2019-07-03 ENCOUNTER — Other Ambulatory Visit: Payer: Self-pay

## 2019-07-03 DIAGNOSIS — E785 Hyperlipidemia, unspecified: Secondary | ICD-10-CM

## 2019-07-03 DIAGNOSIS — R739 Hyperglycemia, unspecified: Secondary | ICD-10-CM

## 2019-07-03 DIAGNOSIS — E010 Iodine-deficiency related diffuse (endemic) goiter: Secondary | ICD-10-CM

## 2019-07-04 LAB — LIPID PANEL W/O CHOL/HDL RATIO
Cholesterol, Total: 182 mg/dL (ref 100–199)
HDL: 36 mg/dL — ABNORMAL LOW (ref >39)
LDL Chol Calc (NIH): 126 mg/dL — ABNORMAL HIGH (ref 0–99)
Triglycerides: 110 mg/dL (ref 0–149)
VLDL Cholesterol Cal: 20 mg/dL (ref 5–40)

## 2019-07-04 LAB — HGB A1C W/O EAG: Hgb A1c MFr Bld: 5.4 % (ref 4.8–5.6)

## 2019-07-04 LAB — TSH: TSH: 0.91 u[IU]/mL (ref 0.450–4.500)

## 2019-07-10 ENCOUNTER — Ambulatory Visit: Payer: Self-pay | Admitting: Internal Medicine

## 2019-07-21 ENCOUNTER — Other Ambulatory Visit: Payer: Self-pay

## 2019-07-21 DIAGNOSIS — N644 Mastodynia: Secondary | ICD-10-CM

## 2019-07-22 ENCOUNTER — Encounter: Payer: Self-pay | Admitting: Internal Medicine

## 2019-07-22 ENCOUNTER — Other Ambulatory Visit: Payer: Self-pay

## 2019-07-22 ENCOUNTER — Ambulatory Visit (INDEPENDENT_AMBULATORY_CARE_PROVIDER_SITE_OTHER): Payer: Self-pay | Admitting: Internal Medicine

## 2019-07-22 VITALS — BP 100/70 | HR 66 | Resp 12 | Ht 59.0 in | Wt 143.0 lb

## 2019-07-22 DIAGNOSIS — Z6830 Body mass index (BMI) 30.0-30.9, adult: Secondary | ICD-10-CM

## 2019-07-22 DIAGNOSIS — R739 Hyperglycemia, unspecified: Secondary | ICD-10-CM

## 2019-07-22 DIAGNOSIS — J3089 Other allergic rhinitis: Secondary | ICD-10-CM

## 2019-07-22 DIAGNOSIS — E782 Mixed hyperlipidemia: Secondary | ICD-10-CM

## 2019-07-22 DIAGNOSIS — E669 Obesity, unspecified: Secondary | ICD-10-CM

## 2019-07-22 DIAGNOSIS — R0789 Other chest pain: Secondary | ICD-10-CM

## 2019-07-22 MED ORDER — FLUTICASONE PROPIONATE 50 MCG/ACT NA SUSP: 2.0000 | Freq: Every day | NASAL | 11 refills | Status: AC

## 2019-07-22 NOTE — Progress Notes (Signed)
Subjective:    Patient ID: Denise Ruiz, female   DOB: 1975-09-24, 44 y.o.   MRN: [id number]   HPI   1.  Hyperlipidemia:  HDL and Trigs better.  Lipid Panel     Component Value Date/Time   CHOL 182 07/03/2019 0922   TRIG 110 07/03/2019 0922   HDL 36 (L) 07/03/2019 0922   CHOLHDL 5.9 05/03/2018 0941   VLDL 21 05/03/2018 0941   LDLCALC 126 (H) 07/03/2019 0922   LABVLDL 20 07/03/2019 0922   Discussed at length regular physical activity and foods high in good fats will get her HDL up.    2.  Hyperglycemia:  A1C good at 5.4%.  She has been exercising more and eating less.     3.  Thyroid:  TSH normal.  4.  Chest Pain:  Pain at left axillary line in past. That is now good.  For past month, she now has left breast and chest pain that radiates around at same level to her back.  If she twists her thorax, actually makes it feels better.   No memory of overuse or acute injury prior to start of pain. When she twists her thorax, she will feel a "pop" in her spine area at bra clasp area and the pain will immediately go away.   The pain can come on at any time--at rest or with exertion.   She has been walking more with her kids or gardening.  She does get the pain with walking or gardening as well, if so:   she twists her torso and then the pain resolves and she continues walking without the pain.  The pain may return 1/2 hour later, however.  She can have the pain 2-4 times daily and lasts 5 - 10 minutes, but resolves with torso twisting and stretching. Father's siblings with history of heart disease.  5.  Allergies:  Taking Cetirizine and helps, but still with need to clear throat.  Eyes itch,  Sneezing.  Using Afrin, not clear how often.   Current Meds  Medication Sig  . cetirizine (ZYRTEC ALLERGY) 10 MG tablet Take 1 tablet (10 mg total) by mouth daily.  . clindamycin (CLEOCIN-T) 1 % external solution Apply topically 2 (two) times daily.  . Magnesium 250 MG TABS Take by  mouth. 1 daily  . Omega-3 Fatty Acids (FISH OIL PO) Take by mouth daily.   Allergies  Allergen Reactions  . Kiwi Extract     Throat swells--does not happen all the time  . Soy Allergy Other (See Comments)    Throat swells     Review of Systems    Objective:   BP 100/70 (BP Location: Left Arm, Patient Position: Sitting, Cuff Size: Normal)   Pulse 66   Resp 12   Ht 4\' 11"  (1.499 m)   Wt 143 lb (64.9 kg)   LMP 07/18/2019   BMI 28.88 kg/m   Physical Exam  NAD HEENT:  PERRL, EOMI, conjunctivae without injection Nasal mucosa swollen/boggy with clear discharge.  Unable to adequately visualize pharynx. Neck:  Supple, No adenopathy Chest:  CTA CV:  RRR with normal S1 and S2, No S3, S4 or murmur.  Radial and DP pulses normal and equal MS/Chest:  AP compression of left side of chest reproduces chest discomfort.  Pt. States feels like chest needs further compression for something to pop in place.     Assessment & Plan   1.  Dyslipidemia:  Some improvement, but would like  to have her at goal.  Discussed dietary and increase in physical activity changes to improve HDL/LDL.  2.  Hyperglycemia:  A1C remains in normal range.  3.  Obesity:  As in #1  4.  Left chest wall pain:  Referral back to PT.    5.  Allergies:  Discussed may need to take a different antihistamine--consider Allegra or Claritin.  Fluticasone nasal spray daily.

## 2019-07-23 ENCOUNTER — Ambulatory Visit: Payer: Self-pay | Admitting: Internal Medicine

## 2019-08-05 ENCOUNTER — Ambulatory Visit
Admission: RE | Admit: 2019-08-05 | Discharge: 2019-08-05 | Disposition: A | Discharge status: Home / Self Care | Payer: No Typology Code available for payment source | Source: Ambulatory Visit | Attending: Obstetrics and Gynecology | Admitting: Obstetrics and Gynecology

## 2019-08-05 ENCOUNTER — Ambulatory Visit: Payer: Self-pay | Admitting: *Deleted

## 2019-08-05 ENCOUNTER — Other Ambulatory Visit: Payer: Self-pay | Admitting: Obstetrics and Gynecology

## 2019-08-05 ENCOUNTER — Other Ambulatory Visit: Payer: Self-pay

## 2019-08-05 VITALS — BP 112/78 | Temp 98.0°F | Wt 145.0 lb

## 2019-08-05 DIAGNOSIS — N644 Mastodynia: Secondary | ICD-10-CM

## 2019-08-05 DIAGNOSIS — Z1239 Encounter for other screening for malignant neoplasm of breast: Secondary | ICD-10-CM

## 2019-08-05 NOTE — Patient Instructions (Signed)
Explained breast self awareness with [name]. Patient did not need a Pap smear today due to last Pap smear and HPV typing was 04/10/2017. Let her know BCCCP will cover Pap smears and HPV typing every 5 years unless has a history of abnormal Pap smears. Referred patient to the [facility] for a left breast diagnostic mammogram and breast ultrasound. Appointment scheduled Tuesday, Aug 05, 2019 at 1050. Patient aware of appointment and will be there. Denise Ruiz verbalized understanding.  Andrw Mcguirt, Kathaleen Maser, RN 11:58 AM

## 2019-08-05 NOTE — Progress Notes (Signed)
[name] is a 44 y.o. female who presents to [facility] clinic today with complaints of left upper outer breast and axillary pain x 3 months. Patient states the pain comes and goes. Patient rates the pain at a 3 out of 10.   Pap Smear: Pap smear not completed today. Last Pap smear was 04/10/2017 at [facility] and normal with negative HPV. Per patient has no history of an abnormal Pap smear. Last two Pap smear results are in Epic.   Physical exam: Breasts Breasts symmetrical. No skin abnormalities bilateral breasts. No nipple retraction bilateral breasts. No nipple discharge bilateral breasts. No lymphadenopathy. No lumps palpated bilateral breasts. Complaints of left upper outer breast tenderness on exam.      Pelvic/Bimanual Pap is not indicated today per BCCCP guidelines.   Smoking History: Patient has never smoked.   Patient Navigation: Patient education provided. Access to services provided for patient through [facility] program. Spanish interpreter Natale Lay from [facility] provided.   Breast and Cervical Cancer Risk Assessment: Patient has a family history of breast cancer of two first degree paternal cousins having breast cancer. Patient has no known genetic mutations or history of radiation treatment to the chest before age 3. Patient has no history of cervical dysplasia, immunocompromised, or DES exposure in-utero.  Risk Assessment    Risk Scores      08/05/2019 01/03/2018   Last edited by: Narda Rutherford, LPN Stoney Bang H, LPN   5-year risk: 0.6 % 0.5 %   Lifetime risk: 8.4 % 8.5 %          A: BCCCP exam without pap smear   P: Referred patient to the [facility] for a left breast diagnostic mammogram and breast ultrasound. Appointment scheduled Tuesday, Aug 05, 2019 at 1050.  Priscille Heidelberg, RN 08/05/2019 11:57 AM

## 2019-09-03 ENCOUNTER — Other Ambulatory Visit: Payer: Self-pay | Admitting: *Deleted

## 2019-09-03 ENCOUNTER — Other Ambulatory Visit: Payer: Self-pay | Admitting: Obstetrics and Gynecology

## 2019-09-03 DIAGNOSIS — Z1231 Encounter for screening mammogram for malignant neoplasm of breast: Secondary | ICD-10-CM

## 2019-09-26 ENCOUNTER — Ambulatory Visit: Payer: Self-pay | Admitting: Internal Medicine

## 2019-09-26 ENCOUNTER — Encounter: Payer: Self-pay | Admitting: Internal Medicine

## 2019-09-26 VITALS — BP 100/70 | HR 60 | Resp 12 | Ht 59.0 in | Wt 145.5 lb

## 2019-09-26 DIAGNOSIS — M7061 Trochanteric bursitis, right hip: Secondary | ICD-10-CM

## 2019-09-26 DIAGNOSIS — M7062 Trochanteric bursitis, left hip: Secondary | ICD-10-CM

## 2019-09-26 DIAGNOSIS — I83813 Varicose veins of bilateral lower extremities with pain: Secondary | ICD-10-CM

## 2019-09-26 NOTE — Patient Instructions (Signed)
Tome ibuprofen 200 mg pastillas, 3 pastillas dos veces al dia con de la comida para 14 dias.  Despues 14 dias, solamente cuando necesita para dolor.  Stretches of hips twice daily  No flip flops or bare feet.

## 2019-09-26 NOTE — Progress Notes (Signed)
Subjective:    Patient ID: Denise Ruiz, female   DOB: 07/18/1975, 44 y.o.   MRN: [id number]   HPI    Difficult history.  Numbness and tingling in bilateral legs.  Sounds like mainly left great and second toes and sometimes right great toe and right anterior ankle.  Also right medial thigh more so than left medial thigh. Later, states can have discomfort involving the entire right leg, it appears down the lateral right upper thigh and her entire leg.  Much less so on the left. Has the discomfort mainly with sitting and lying down.  Generally is lying on her side when she develops the pain.  If she switches to the other side, then it starts hurting as well. She feels she has weakness in her right side.  Feels weak when pressing on car brake with her right leg as well.    Does recall stepping down with right foot in flip flops onto rocks and developed pain at MT head of right 2nd ray about 1 week before this discomfort started.  She does not recall any other area with pain right afterward.  She does state she changed the way she walked subsequently due to the pain. At home, she generally wears flip flops, but when out of the home, tennis shoes.  She feels the latter have good support.  Her foot felt better wearing the tennis shoes.   No history of injury to her back in any way.  States has been walking more recently.  Maybe 15 minutes more per day.    No urinary or fecal incontinence.     Current Meds  Medication Sig  . cetirizine (ZYRTEC ALLERGY) 10 MG tablet Take 1 tablet (10 mg total) by mouth daily.  . fluticasone (FLONASE) 50 MCG/ACT nasal spray Place 2 sprays into both nostrils daily.  Marland Kitchen GARLIC 1500 PO Take by mouth. 1 daily  . Magnesium 250 MG TABS Take by mouth. 1 daily  . Omega-3 Fatty Acids (FISH OIL PO) Take by mouth daily.   Allergies  Allergen Reactions  . Kiwi Extract     Throat swells--does not happen all the time  . Soy Allergy Other (See Comments)     Throat swells     Review of Systems    Objective:   BP 100/70 (BP Location: Right Arm, Patient Position: Sitting, Cuff Size: Normal)   Pulse 60   Resp 12   Ht 4\' 11"  (1.499 m)   Wt 145 lb 8 oz (66 kg)   LMP 09/20/2019 (Exact Date)   BMI 29.39 kg/m   Physical Exam  NAD Lungs:  CTA CV:  RRR without murmur or rub.  Radial and DP pulses normal and equal. Abd:  S, NT, No HSM or mass, + BS LE:  Bilateral varicosities of LE MS:  NT over spinous processes of L/S spine.  NT over L/S paraspinous musculature.  Tender over bilateral greater trochanters. Neuro, LE:  Motor 5/5 and DTRs 2+/4 throughout. Gait normal.     Assessment & Plan  1.  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 Encouraged elevation of legs when not walking or moving.  No standing in place, particularly on hard surfaces.   Thigh high graduated compression stockings recommended for daily use.  Discussed how to measure for fit and order. Stretches for her bursitis along with anti inflammatory treatment twice daily for 14 days.  To call if does not gradually improve.

## 2019-12-16 ENCOUNTER — Telehealth: Payer: Self-pay | Admitting: Internal Medicine

## 2019-12-16 NOTE — Telephone Encounter (Signed)
Patient called stating is having burning on her legs and ankles  when uses tight socks and shoes. Patient stated thinks is having problems with circulation and will like to know if can takes baby aspirin to help with the circulation. Also patient requesting advise or appointment for burning on her ankles and legs.

## 2019-12-17 NOTE — Telephone Encounter (Signed)
Please schedule patient follow up to discuss

## 2019-12-17 NOTE — Telephone Encounter (Signed)
Patient scheduled for 01/27/2020 @ 9:30 am- placed on waiting list as well.

## 2019-12-26 ENCOUNTER — Telehealth: Payer: Self-pay | Admitting: Internal Medicine

## 2019-12-26 NOTE — Telephone Encounter (Signed)
Should be okay to take those meds. She needs to decide which clinic she is going to go to for primary care.   For safety issues we do not follow patients who go back and forth with their care with another clinic.   This was something we should have seen her for here.  We can do the labs she listed, but will need to check her chart as to whether they are necessary --the other clinic cannot see if those were done in our clinic--another reason not to go back and forth. If she chooses to stay with [place], she will need to forward her records from the [facility] clinic so I can see why they wanted them.

## 2019-12-26 NOTE — Telephone Encounter (Signed)
Patient called stating was seen yesterday at [facility] on [facility] [phone] due to chest pain and acid reflux x three days.  Patient stated was diagnosed with Helicobacter Pylori and was prescribed with omeprazole, amoxicillin and metronidazole. Patient will like to know if that is ok to take those medications. Patient also stating was told needs FLP, cbc, kidney function labs but they are really expensive to get it done there; will like to know if can get it done here.  Please advise.

## 2019-12-29 NOTE — Telephone Encounter (Signed)
Spoke with patient; information provided in full detail. Patient stated had the labs done last Saturday in the same place.  Patient stated will like to continue having care here. Stated will bring records from there.

## 2020-01-05 DIAGNOSIS — M7062 Trochanteric bursitis, left hip: Secondary | ICD-10-CM | POA: Insufficient documentation

## 2020-01-05 DIAGNOSIS — M7061 Trochanteric bursitis, right hip: Secondary | ICD-10-CM | POA: Insufficient documentation

## 2020-01-05 DIAGNOSIS — I83813 Varicose veins of bilateral lower extremities with pain: Secondary | ICD-10-CM | POA: Insufficient documentation

## 2020-01-27 ENCOUNTER — Encounter: Payer: Self-pay | Admitting: Internal Medicine

## 2020-01-27 ENCOUNTER — Ambulatory Visit (INDEPENDENT_AMBULATORY_CARE_PROVIDER_SITE_OTHER): Payer: Self-pay | Admitting: Internal Medicine

## 2020-01-27 ENCOUNTER — Other Ambulatory Visit: Payer: Self-pay

## 2020-01-27 VITALS — BP 110/70 | HR 68 | Resp 15 | Ht 58.5 in | Wt 134.0 lb

## 2020-01-27 DIAGNOSIS — M7062 Trochanteric bursitis, left hip: Secondary | ICD-10-CM

## 2020-01-27 DIAGNOSIS — K219 Gastro-esophageal reflux disease without esophagitis: Secondary | ICD-10-CM | POA: Insufficient documentation

## 2020-01-27 DIAGNOSIS — M7061 Trochanteric bursitis, right hip: Secondary | ICD-10-CM

## 2020-01-27 DIAGNOSIS — R7689 Other specified abnormal immunological findings in serum: Secondary | ICD-10-CM | POA: Insufficient documentation

## 2020-01-27 DIAGNOSIS — R768 Other specified abnormal immunological findings in serum: Secondary | ICD-10-CM

## 2020-01-27 DIAGNOSIS — I83813 Varicose veins of bilateral lower extremities with pain: Secondary | ICD-10-CM

## 2020-01-27 DIAGNOSIS — F439 Reaction to severe stress, unspecified: Secondary | ICD-10-CM

## 2020-01-27 DIAGNOSIS — Z23 Encounter for immunization: Secondary | ICD-10-CM

## 2020-01-27 MED ORDER — PANTOPRAZOLE SODIUM 40 MG PO TBEC: 40.0000 mg | DELAYED_RELEASE_TABLET | Freq: Every day | ORAL | 6 refills | Status: DC

## 2020-01-27 NOTE — Progress Notes (Signed)
Subjective:    Patient ID: Denise Ruiz, female   DOB: Dec 18, 1975, 44 y.o.   MRN: [id number]   HPI   Interpreted by Trinda Pascal  1.  Here today with same issue with pain in legs and burning in feet.  States she started taking Vitamin B and has noted an improvement.  Basically she is taking a Vitamin B complex.  Only has the discomfort in legs and feet when tired.    When seen in July, felt to have factors of varicose veins and greater trochanteric bursitis.  She has not obtained graduated compression stockings--states it was too hot.    She has been performing stretches for greater trochanteric bursitis and performing strengthening with both adductors and abductors of hips at the gym.  2.  Feels she has GERD:  Pain in center of chest and nausea.  Burning up into her throat.  Taking Omeprazole 20 mg twice daily, but is now out.   She was treated for H. Pylori with Amoxicillin and Metronidazole along with Omeprazole about 20 days ago.  She decreased to once daily of Omeprazole about 1 week ago.  This was at a clinic on [address] in September.  See recent telephone encounters regarding this. No melena or hematochezia.  She has cut out spicy, fatty, acidic foods.   She does not eat or drink caffeinated foods/beverages.   Does eat onions. Not much in way of tomatoes. No sodas now.   No chocolate. No tobacco or alcohol.   No NSAIDS. Has not elevated HOB Stays upright for at least 2 hours after eating, but does snack in night --gets up and eats an apple.   She admits to a lot of stress when this started.  She would be interested in working with our counselor, Denise Clap, LCSW.        Current Meds  Medication Sig   cetirizine (ZYRTEC ALLERGY) 10 MG tablet Take 1 tablet (10 mg total) by mouth daily.   GARLIC 1500 PO Take by mouth. 1 daily   Thiamine HCl (VITAMIN B-1) 250 MG tablet Take 250 mg by mouth daily.   Allergies  Allergen Reactions   Kiwi Extract      Throat swells--does not happen all the time   Soy Allergy Other (See Comments)    Throat swells   Imm:  Did get both Moderna vaccinations here--not charted in Epic yet  Review of Systems    Objective:   BP 110/70 (BP Location: Left Leg, Patient Position: Standing, Cuff Size: Normal)   Pulse 68   Resp 15   Ht 4' 10.5" (1.486 m)   Wt 134 lb (60.8 kg)   LMP 01/06/2020 (Exact Date)   BMI 27.53 kg/m   Physical Exam  NAD Lungs:  CTA CV:  RRR without murmur or rub.  Radial and DP pulses normal and equal Abd:  S, NT, No HSM or mass, + BS LE:  Legs muscular with varicosities and significant spidering of superficial veins of legs. Assessment & Plan   1.  Leg and foot discomfort:  Multifactorial and improved.  Encouraged obtaining thigh high compression stockings.  2.  GERD:  Need to find her records from other clinic, which we have apparently received and see if needs other care.  Switch to Pantoprazole 40 mg daily and to take on empty stomach. Went over GERD precautions.    3.  Stress:  referral to Denise Tol, LCSW.  4.  HM:  Influenza vaccine.

## 2020-01-28 ENCOUNTER — Encounter: Payer: Self-pay | Admitting: Clinical

## 2020-01-28 ENCOUNTER — Telehealth: Payer: Self-pay | Admitting: Clinical

## 2020-01-28 NOTE — Telephone Encounter (Signed)
LCSW contacted patient due to referral from PCP. Patient reports she is interested. LCSW informed of cost would be $30. Patient reports she is just now going to apply for orange card sliding fee program. LCSW asked if she wanted to wait for her orange card to approve or be referred to a lower cost agency. Patient reports she would like to a lower cost program, LCSW informed of [facility]. LCSW asked if she wanted social worker to complete referral, patient reports she would like this.

## 2020-01-28 NOTE — Progress Notes (Signed)
LCSW completed [facility] Referral via email.

## 2020-02-13 ENCOUNTER — Other Ambulatory Visit: Payer: Self-pay

## 2020-02-13 VITALS — BP 114/68 | HR 64

## 2020-02-13 DIAGNOSIS — R739 Hyperglycemia, unspecified: Secondary | ICD-10-CM

## 2020-02-13 DIAGNOSIS — E782 Mixed hyperlipidemia: Secondary | ICD-10-CM

## 2020-02-14 LAB — CBC WITH DIFFERENTIAL/PLATELET
Basophils Absolute: 0 10*3/uL (ref 0.0–0.2)
Basos: 1 %
EOS (ABSOLUTE): 0 10*3/uL (ref 0.0–0.4)
Eos: 1 %
Hematocrit: 40.3 % (ref 34.0–46.6)
Hemoglobin: 13.8 g/dL (ref 11.1–15.9)
Immature Grans (Abs): 0 10*3/uL (ref 0.0–0.1)
Immature Granulocytes: 0 %
Lymphocytes Absolute: 1.7 10*3/uL (ref 0.7–3.1)
Lymphs: 39 %
MCH: 30.6 pg (ref 26.6–33.0)
MCHC: 34.2 g/dL (ref 31.5–35.7)
MCV: 89 fL (ref 79–97)
Monocytes Absolute: 0.2 10*3/uL (ref 0.1–0.9)
Monocytes: 5 %
Neutrophils Absolute: 2.4 10*3/uL (ref 1.4–7.0)
Neutrophils: 54 %
Platelets: 249 10*3/uL (ref 150–450)
RBC: 4.51 x10E6/uL (ref 3.77–5.28)
RDW: 13.1 % (ref 11.7–15.4)
WBC: 4.5 10*3/uL (ref 3.4–10.8)

## 2020-02-14 LAB — COMPREHENSIVE METABOLIC PANEL
ALT: 23 IU/L (ref 0–32)
AST: 21 IU/L (ref 0–40)
Albumin/Globulin Ratio: 1.8 (ref 1.2–2.2)
Albumin: 4.4 g/dL (ref 3.8–4.8)
Alkaline Phosphatase: 57 IU/L (ref 44–121)
BUN/Creatinine Ratio: 13 (ref 9–23)
BUN: 9 mg/dL (ref 6–24)
Bilirubin Total: 0.3 mg/dL (ref 0.0–1.2)
CO2: 23 mmol/L (ref 20–29)
Calcium: 9.3 mg/dL (ref 8.7–10.2)
Chloride: 103 mmol/L (ref 96–106)
Creatinine, Ser: 0.72 mg/dL (ref 0.57–1.00)
GFR calc Af Amer: 118 mL/min/{1.73_m2} (ref >59)
GFR calc non Af Amer: 102 mL/min/{1.73_m2} (ref >59)
Globulin, Total: 2.5 g/dL (ref 1.5–4.5)
Glucose: 87 mg/dL (ref 65–99)
Potassium: 4.7 mmol/L (ref 3.5–5.2)
Sodium: 142 mmol/L (ref 134–144)
Total Protein: 6.9 g/dL (ref 6.0–8.5)

## 2020-02-14 LAB — LIPID PANEL W/O CHOL/HDL RATIO
Cholesterol, Total: 119 mg/dL (ref 100–199)
HDL: 34 mg/dL — ABNORMAL LOW (ref >39)
LDL Chol Calc (NIH): 74 mg/dL (ref 0–99)
Triglycerides: 49 mg/dL (ref 0–149)
VLDL Cholesterol Cal: 11 mg/dL (ref 5–40)

## 2020-03-05 ENCOUNTER — Ambulatory Visit: Payer: Self-pay | Admitting: Internal Medicine

## 2020-03-05 ENCOUNTER — Encounter: Payer: Self-pay | Admitting: Internal Medicine

## 2020-03-05 VITALS — BP 120/73 | HR 61 | Resp 12 | Ht 58.5 in | Wt 127.0 lb

## 2020-03-05 DIAGNOSIS — K219 Gastro-esophageal reflux disease without esophagitis: Secondary | ICD-10-CM

## 2020-03-05 DIAGNOSIS — Z6830 Body mass index (BMI) 30.0-30.9, adult: Secondary | ICD-10-CM

## 2020-03-05 DIAGNOSIS — N841 Polyp of cervix uteri: Secondary | ICD-10-CM | POA: Insufficient documentation

## 2020-03-05 DIAGNOSIS — E785 Hyperlipidemia, unspecified: Secondary | ICD-10-CM

## 2020-03-05 DIAGNOSIS — E669 Obesity, unspecified: Secondary | ICD-10-CM

## 2020-03-05 DIAGNOSIS — N898 Other specified noninflammatory disorders of vagina: Secondary | ICD-10-CM

## 2020-03-05 DIAGNOSIS — R350 Frequency of micturition: Secondary | ICD-10-CM

## 2020-03-05 LAB — POCT WET PREP WITH KOH
Clue Cells Wet Prep HPF POC: NEGATIVE
KOH Prep POC: NEGATIVE
RBC Wet Prep HPF POC: NEGATIVE
Trichomonas, UA: NEGATIVE
Yeast Wet Prep HPF POC: NEGATIVE

## 2020-03-05 LAB — POCT URINALYSIS DIPSTICK
Bilirubin, UA: NEGATIVE
Blood, UA: NEGATIVE
Glucose, UA: NEGATIVE
Ketones, UA: NEGATIVE
Leukocytes, UA: NEGATIVE
Nitrite, UA: NEGATIVE
Protein, UA: NEGATIVE
Spec Grav, UA: 1.01 (ref 1.010–1.025)
Urobilinogen, UA: NEGATIVE E.U./dL — AB
pH, UA: 7 (ref 5.0–8.0)

## 2020-03-05 NOTE — Progress Notes (Signed)
    Subjective:    Patient ID: Denise Ruiz, female   DOB: 12/05/1975, 44 y.o.   MRN: [id number]   HPI   Liz Beach interprets  1.  Vaginal discharge for 4 days with odor.  Maybe like fishy odor.  Has some vaginal burning and itching.  At night, some suprapubic pressure.  Has urinary urgency and frequency.  Mild dysuria.   No pelvic pain No fever. Monogamous with husband.  2.  Discussed labs from 02/13/20:  Cholesterol in particular much better, but to continue to work on diet and physical activity to get HDL up She has lost 20 lbs since 03/2019.     Current Meds  Medication Sig  . cetirizine (ZYRTEC ALLERGY) 10 MG tablet Take 1 tablet (10 mg total) by mouth daily.  . fluticasone (FLONASE) 50 MCG/ACT nasal spray Place 2 sprays into both nostrils daily.  . pantoprazole (PROTONIX) 40 MG tablet Take 1 tablet (40 mg total) by mouth daily.  . Thiamine HCl (VITAMIN B-1) 250 MG tablet Take 250 mg by mouth daily.   Allergies  Allergen Reactions  . Kiwi Extract     Throat swells--does not happen all the time  . Soy Allergy Other (See Comments)    Throat swells     Review of Systems    Objective:   BP 120/73 (BP Location: Right Arm, Patient Position: Sitting, Cuff Size: Normal)   Pulse 61   Resp 12   Ht 4' 10.5" (1.486 m)   Wt 127 lb (57.6 kg)   LMP 02/09/2020   BMI 26.09 kg/m   Physical Exam  NAD Lungs:  CTA CV:  RRR without murmur or rub.  Radial and DP pulses normal and equal Abd:  S, minimal suprapubic tenderness.  No flank tenderness, No HSM or mass, + BS GU:  Normal external female genitalia, very scant clear vaginal secretions.  No odor.  No vaginal mucosal lesions.   Cervix at 12 O'Clock with deformation--almost pointed and dog-eared down over cervical os.  With manipulation of speculum, able to get this part of cervix pointed upward with obvious 1 cm or larger beefy red cervical polyp extending from os.  No other cervical lesion.   No uterine  or adnexal mass or tenderness.  UA and wet prep normal  Assessment & Plan  1.  Vaginal discharge:  No findings today to support infection.  Sending GC/chlamydia, which expect to be negative.  2.  Cervical Polyp:   Referral to OB/GYN to evaluate and remove.  Not sure if this could be causing some discharge that is not evident today.  3.  Urinary complaints:  Again, no findings on UA  4.  GERD:  Patient brings this up.  Wants to know how long to continue the Pantoprazole.  She has a follow up for CPE in the coming months.  Discussed should continue until then.  5.  Low HDL:  Discussed labs excellent save for this.  Encouraged to start some exercise outside of work and gradually increase.    Discussed foods high in good fats.  Congratulated her on lifestyle changes that have improved her weight and cholesterol  6.  HM:  Influenza vaccine today.  Encouraged booster for Moderna COVID vaccine.

## 2020-03-10 LAB — GC/CHLAMYDIA PROBE AMP
Chlamydia trachomatis, NAA: NEGATIVE
Neisseria Gonorrhoeae by PCR: NEGATIVE

## 2020-04-08 ENCOUNTER — Encounter: Payer: Self-pay | Admitting: Internal Medicine

## 2020-04-08 ENCOUNTER — Other Ambulatory Visit: Payer: Self-pay

## 2020-04-08 ENCOUNTER — Other Ambulatory Visit: Payer: Self-pay | Admitting: Internal Medicine

## 2020-04-08 ENCOUNTER — Ambulatory Visit: Payer: Self-pay | Admitting: Internal Medicine

## 2020-04-08 VITALS — BP 113/69 | HR 53 | Resp 12 | Ht 58.5 in | Wt 129.0 lb

## 2020-04-08 DIAGNOSIS — Z124 Encounter for screening for malignant neoplasm of cervix: Secondary | ICD-10-CM

## 2020-04-08 DIAGNOSIS — K588 Other irritable bowel syndrome: Secondary | ICD-10-CM

## 2020-04-08 DIAGNOSIS — E785 Hyperlipidemia, unspecified: Secondary | ICD-10-CM

## 2020-04-08 DIAGNOSIS — K219 Gastro-esophageal reflux disease without esophagitis: Secondary | ICD-10-CM

## 2020-04-08 DIAGNOSIS — Z1231 Encounter for screening mammogram for malignant neoplasm of breast: Secondary | ICD-10-CM

## 2020-04-08 DIAGNOSIS — Z0000 Encounter for general adult medical examination without abnormal findings: Secondary | ICD-10-CM

## 2020-04-08 DIAGNOSIS — N841 Polyp of cervix uteri: Secondary | ICD-10-CM

## 2020-04-08 NOTE — Progress Notes (Signed)
Subjective:    Patient ID: Denise Ruiz, female   DOB: [birth year], 45 y.o.   MRN: [id number]   HPI   CPE with pap  1.  Pap:  Last pap was 03/2017 and normal.    2.  Mammogram:  Last performed on left side only last year due to left axillary discomfort.  No concerning findings.  Did not get bilateral exam, however.  3.  Osteoprevention:  She thinks she can drink 3-4 cups of almond milk daily.  Had stopped as having problems with reflux, but reflux did not improve. Walks and uses bicycle 5-6 times weekly with gym.  4.  Guaiac Cards:  States she has performed in past, but not documented as so in chart.    5.  Colonoscopy:  Never.  No family history of colon cancer.    6.  Immunizations:  Has not had Moderna COVID booster. Immunization History  Administered Date(s) Administered  . Influenza Inj Mdck Quad With Preservative 01/27/2020  . Influenza,inj,Quad PF,6+ Mos 12/11/2014, 01/09/2019  . Influenza-Unspecified 01/09/2019  . Moderna Sars-Covid-2 Vaccination 08/04/2019, 09/01/2019  . Tdap 12/11/2014     7.  Glucose/Cholesterol:  Normal A1C in past and blood glucose in November in 2021 was normal.  Cholesterol much improved in November as well.  HDL remains low.   Lipid Panel     Component Value Date/Time   CHOL 119 02/13/2020 1040   TRIG 49 02/13/2020 1040   HDL 34 (L) 02/13/2020 1040   CHOLHDL 5.9 05/03/2018 0941   VLDL 21 05/03/2018 0941   LDLCALC 74 02/13/2020 1040   LABVLDL 11 02/13/2020 1040   Other:  Recent CBC, CMP, FLP normal.  Current Meds  Medication Sig  . cetirizine (ZYRTEC ALLERGY) 10 MG tablet Take 1 tablet (10 mg total) by mouth daily.  . fluticasone (FLONASE) 50 MCG/ACT nasal spray Place 2 sprays into both nostrils daily.  . pantoprazole (PROTONIX) 40 MG tablet Take 1 tablet (40 mg total) by mouth daily.  . Thiamine HCl (VITAMIN B-1) 250 MG tablet Take 250 mg by mouth daily.   Allergies  Allergen Reactions  . Kiwi Extract     Throat  swells--does not happen all the time  . Soy Allergy Other (See Comments)    Throat swells   Past Medical History:  Diagnosis Date  . Bilateral ovarian cysts   . Chronic sinusitis   . Dyslipidemia, goal LDL below 130   . GERD (gastroesophageal reflux disease)   . Hidradenitis axillaris   . Varicose vein     Past Surgical History:  Procedure Laterality Date  . NO PAST SURGERIES     Family History  Problem Relation Age of Onset  . Heart attack Paternal Aunt   . Heart attack Paternal Uncle   . Cancer Maternal Grandmother        liver  . Heart attack Paternal Aunt   . Breast cancer Cousin   . Cancer Cousin        Breast cancer:  43, 60,   . Hyperlipidemia Mother   . Gallbladder disease Mother   . Thyroid disease Sister   . Arthritis Brother        Rheumatoid  . Congenital heart disease Daughter        ?VSD vs. ASD-repaired.  . Hip dysplasia Daughter        congenital  . Scoliosis Daughter   . Other Son        Recurrent ear infections--plans for T  tubes at age 31 yo  . Other Daughter        Ureteral reflux    Social History   Socioeconomic History  . Marital status: Married    Spouse name: Rosemarie Ax  . Number of children: 4  . Years of education: 75  . Highest education level: 12th grade  Occupational History  . 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 Smoking status: Never Smoker  . Smokeless tobacco: Never Used  Vaping Use  . Vaping Use: Never used  Substance and Sexual Activity  . Alcohol use: No  . Drug use: No  . Sexual activity: Yes    Birth control/protection: Condom  Other Topics Concern  . Not on file  Social History Narrative   Lives at home with husband and 4 children   Social Determinants of Health   Financial Resource Strain: Low Risk   . Difficulty of Paying Living Expenses: Not hard at all  Food Insecurity: No Food Insecurity  . Worried About Programme researcher, broadcasting/film/video in the Last Year: Never true  . Ran Out of Food in the Last  Year: Never true  Transportation Needs: No Transportation Needs  . Lack of Transportation (Medical): No  . Lack of Transportation (Non-Medical): No  Physical Activity: Not on file  Stress: Not on file  Social Connections: Not on file  Intimate Partner Violence: Not At Risk  . Fear of Current or Ex-Partner: No  . Emotionally Abused: No  . Physically Abused: No  . Sexually Abused: No     Review of Systems  Gastrointestinal:       In the morning, feels full even before eating frequently.  Also has early satiety with eating.   Feels with almost every meal or eating, food gest stuck in her chest.  No acid into her throat, though in the morning, does have a bad taste in mouth. Feels a lot of gas in her upper abdomen and needs to belch.  If able to belch, feels better.  Pain radiates to back at times.  Describes a sense of balloon blowing up in epigastric area and chest.   States has the discomfort about 1/2 hour after eating and lasts for 1 hour. No melena or hematochezia. She does not lie down after eating. She does have the head of her bed elevated Daily difficulty having a BM. Stools are not hard, but feels need to pass and cannot get much out.  Lots of gas.   Never with loose stools.        Objective:   BP 113/69 (BP Location: Left Arm, Patient Position: Sitting, Cuff Size: Normal)   Pulse (!) 53   Resp 12   Ht 4' 10.5" (1.486 m)   Wt 129 lb (58.5 kg)   LMP 03/09/2020 (Exact Date)   BMI 26.50 kg/m   Physical Exam Constitutional:      Appearance: Normal appearance.  HENT:     Head: Normocephalic and atraumatic.     Right Ear: Tympanic membrane, ear canal and external ear normal.     Left Ear: Tympanic membrane, ear canal and external ear normal.     Nose: Nose normal.     Mouth/Throat:     Mouth: Mucous membranes are moist.     Pharynx: Oropharynx is clear. Uvula midline.  Eyes:     Extraocular Movements: Extraocular movements intact.     Conjunctiva/sclera:  Conjunctivae normal.     Pupils: Pupils are equal, round, and  reactive to light.     Comments: Discs sharp bilaterally.  Cardiovascular:     Rate and Rhythm: Normal rate and regular rhythm.     Heart sounds: S1 normal and S2 normal. No murmur heard. No friction rub. No S3 or S4 sounds.      Comments: No carotid bruits.  Carotid, radial, femoral, DP and PT pulses normal and equal.  Pulmonary:     Effort: Pulmonary effort is normal.     Breath sounds: Normal breath sounds.  Chest:  Breasts:     Right: No mass, nipple discharge, skin change, axillary adenopathy or supraclavicular adenopathy.     Left: No mass, nipple discharge, skin change, axillary adenopathy or supraclavicular adenopathy.    Abdominal:     General: Bowel sounds are normal.     Palpations: Abdomen is soft. There is no hepatomegaly, splenomegaly or mass.     Tenderness: There is no abdominal tenderness.     Hernia: No hernia is present.  Genitourinary:    Comments: Normal external female genitalia.   Beefy red cervical polyp present  No uterine or adnexal mass or tenderness. Musculoskeletal:        General: Normal range of motion.     Cervical back: Normal range of motion and neck supple.     Right lower leg: No edema.     Left lower leg: No edema.  Lymphadenopathy:     Head:     Right side of head: No submental or submandibular adenopathy.     Left side of head: No submental or submandibular adenopathy.     Cervical: No cervical adenopathy.     Upper Body:     Right upper body: No supraclavicular or axillary adenopathy.     Left upper body: No supraclavicular or axillary adenopathy.     Lower Body: No right inguinal adenopathy. No left inguinal adenopathy.  Skin:    General: Skin is warm.     Capillary Refill: Capillary refill takes less than 2 seconds.     Findings: No rash.  Neurological:     Mental Status: She is alert.     Cranial Nerves: Cranial nerves are intact.     Sensory: Sensation is intact.      Motor: Motor function is intact.     Coordination: Coordination is intact.     Deep Tendon Reflexes: Reflexes are normal and symmetric.  Psychiatric:        Attention and Perception: Attention normal.        Mood and Affect: Mood normal.        Speech: Speech normal.        Behavior: Behavior normal. Behavior is cooperative.        Thought Content: Thought content normal.        Judgment: Judgment normal.      Assessment & Plan   1.  CPE with pap:   Schedule mammogram Encouraged Covid Moderna Booster on Monday with vaccine clinic.  2.  GERD/upper GI complaints.  On long term Pantoprazole with continued issues.  Treated previously for H. Pylori.  Has lost 16 lbs since July 2021, which sounds purposeful, but with GI symptoms, concerned needs endoscopy. Likely some IBS, constipation dominant, complicating picture. To start fiber laxative daily Needs orange card for GI referral--encouraged to get that done ASAP and notify clinic when application in. Follow up in 3 months.  3.  Cervical Polyp:  [facility]

## 2020-04-08 NOTE — Patient Instructions (Signed)
Metamucil o Citrucel in 8 oz agua daily

## 2020-04-13 ENCOUNTER — Other Ambulatory Visit: Payer: Self-pay | Admitting: Internal Medicine

## 2020-04-19 LAB — CYTOLOGY - PAP

## 2020-04-20 ENCOUNTER — Other Ambulatory Visit (INDEPENDENT_AMBULATORY_CARE_PROVIDER_SITE_OTHER): Payer: Self-pay | Admitting: Internal Medicine

## 2020-04-20 ENCOUNTER — Other Ambulatory Visit: Payer: Self-pay | Admitting: Obstetrics and Gynecology

## 2020-04-20 DIAGNOSIS — Z1211 Encounter for screening for malignant neoplasm of colon: Secondary | ICD-10-CM

## 2020-04-20 DIAGNOSIS — Z1231 Encounter for screening mammogram for malignant neoplasm of breast: Secondary | ICD-10-CM

## 2020-04-21 ENCOUNTER — Telehealth: Payer: Self-pay | Admitting: Radiology

## 2020-04-21 LAB — POC HEMOCCULT BLD/STL (HOME/3-CARD/SCREEN)
Card #2 Fecal Occult Blod, POC: NEGATIVE
Card #3 Fecal Occult Blood, POC: NEGATIVE
Fecal Occult Blood, POC: NEGATIVE

## 2020-04-21 NOTE — Progress Notes (Signed)
Brought in Guaiac Cards x 3  Negative for blood

## 2020-04-21 NOTE — Telephone Encounter (Signed)
Called and left voicemail for patient to call [facility] for referral for Dr Macon Large for [employer]

## 2020-05-18 ENCOUNTER — Ambulatory Visit: Payer: No Typology Code available for payment source

## 2020-06-09 ENCOUNTER — Telehealth: Payer: Self-pay | Admitting: Internal Medicine

## 2020-06-09 DIAGNOSIS — K588 Other irritable bowel syndrome: Secondary | ICD-10-CM | POA: Insufficient documentation

## 2020-06-09 NOTE — Telephone Encounter (Addendum)
Patient called asking for an appointment. Patient stated that she's having burning on the esophagus and the Pantropazole is not helping.Patient stated that she has not been able to sleep for the past two days due to the burning sensation. Please advise.   Patient was referred to Gastroenterology with [facility] and is awaiting her appointment.

## 2020-06-10 ENCOUNTER — Ambulatory Visit (INDEPENDENT_AMBULATORY_CARE_PROVIDER_SITE_OTHER): Payer: Self-pay | Admitting: Obstetrics & Gynecology

## 2020-06-10 ENCOUNTER — Encounter: Payer: Self-pay | Admitting: Obstetrics & Gynecology

## 2020-06-10 ENCOUNTER — Other Ambulatory Visit: Payer: Self-pay

## 2020-06-10 VITALS — BP 109/71 | HR 75 | Ht <= 58 in | Wt 124.0 lb

## 2020-06-10 DIAGNOSIS — N841 Polyp of cervix uteri: Secondary | ICD-10-CM

## 2020-06-10 NOTE — Progress Notes (Signed)
    GYNECOLOGY OFFICE  PROCEDURE NOTE  45 y.o. [id number] here for cervical polyp removal; noted by PCP during her exam on 04/08/2020.   Had normal pap smear and negative HPV.  Patient is Spanish-speaking only, interpreter present for this encounter.  Patient gave informed written consent, time out was performed.  Placed in lithotomy position. Speculum placed in vagina.  4 mm erythematous polypoid lesion noted in the ectocervix with narrow stalk.  CERVICAL POLYPECTOMY NOTE Risks of the removal including pain, bleeding, infection, inadequate specimen, and need for additional procedures were discussed. The patient stated understanding and agreed to undergo procedure today. Consent was signed,  time out performed.  The patient's cervix was prepped with Betadine.  Ring forceps were used to grasp the polypoid lesion, but it was very small and slippery, unable to get good grasp of it.  Fragments were removed but they were very bloody, unable to get adequate tissue specimen for pathology analysis.  Significant bleeding was noted and hemostasis was achieved using silver nitrate sticks and Monsel's solution.    The patient tolerated the procedure well. Post-procedure instructions were given to the patient. The patient is to call with heavy bleeding, fever greater than 100.4, foul smelling vaginal discharge or other concerns.  Will follow up pathology and manage accordingly; patient will be contacted with results and recommendations.     Jaynie Collins, MD, FACOG Obstetrician & Gynecologist, [facility] for [employer], [facility]

## 2020-06-18 ENCOUNTER — Ambulatory Visit: Payer: Self-pay | Admitting: Clinical

## 2020-06-18 ENCOUNTER — Ambulatory Visit: Payer: Self-pay | Admitting: Internal Medicine

## 2020-06-18 ENCOUNTER — Encounter: Payer: Self-pay | Admitting: Internal Medicine

## 2020-06-18 ENCOUNTER — Other Ambulatory Visit: Payer: Self-pay

## 2020-06-18 VITALS — BP 130/66 | HR 64 | Resp 12 | Ht 58.5 in | Wt 123.0 lb

## 2020-06-18 DIAGNOSIS — K219 Gastro-esophageal reflux disease without esophagitis: Secondary | ICD-10-CM

## 2020-06-18 DIAGNOSIS — J3089 Other allergic rhinitis: Secondary | ICD-10-CM

## 2020-06-18 DIAGNOSIS — K588 Other irritable bowel syndrome: Secondary | ICD-10-CM

## 2020-06-18 DIAGNOSIS — F419 Anxiety disorder, unspecified: Secondary | ICD-10-CM

## 2020-06-18 MED ORDER — CETIRIZINE HCL 10 MG PO TABS: 10.0000 mg | ORAL_TABLET | Freq: Every day | ORAL | 11 refills | Status: AC

## 2020-06-18 MED ORDER — OMEPRAZOLE 40 MG PO CPDR: DELAYED_RELEASE_CAPSULE | ORAL | 4 refills | Status: DC

## 2020-06-18 NOTE — Progress Notes (Signed)
    Subjective:    Patient ID: Denise Ruiz, female   DOB: Mar 30, 1975, 45 y.o.   MRN: [id number]   HPI   Denise Ruiz interprets  See GI complaints from her CPE on 04/08/20:  Now with more nausea, feels this started about 2 weeks ago.  This is not constant.  Has when hasn't eaten or just after eating.  Has most days.  Mainly in the morning.  No vomiting.   Did not obtain fiber laxative. She started eating papaya instead and states this helped with having softer more consistent foods. The bloating is miuch better as her stools have been more regular.   We did send her for GI referral through [facility] for possible endoscopy, but that has not come through yet.  She just obtained orange card end of February.   She has lost one pound since January.  She is also having burning in her throat and nose for past 2 weeks associated with hoarseness.  No itchy, watery eyes, nose or throat.  No sneezing.   She is clearing her throat frequently.  No moisture issues in home.  No mold.   She does go out for walks/takes out trash.   Does live in a house surrounded by trees.   Has never had these symptoms before  Continues to take Pantoprazole on an empty stomach.     Current Meds  Medication Sig  . cetirizine (ZYRTEC ALLERGY) 10 MG tablet Take 1 tablet (10 mg total) by mouth daily.  . pantoprazole (PROTONIX) 40 MG tablet Take 1 tablet (40 mg total) by mouth daily.  . Thiamine HCl (VITAMIN B-1) 250 MG tablet Take 250 mg by mouth daily.   Allergies  Allergen Reactions  . Kiwi Extract     Throat swells--does not happen all the time  . Soy Allergy Other (See Comments)    Throat swells     Review of Systems    Objective:   BP 130/66 (BP Location: Left Arm, Patient Position: Sitting)   Pulse 64   Resp 12   Ht 4' 10.5" (1.486 m)   Wt 123 lb (55.8 kg)   LMP 06/11/2020 (Exact Date)   BMI 25.27 kg/m   Physical Exam NAD HEENT:  PERRL, EOMI, conjunctivae without injection.  Nasal  mucosa swollen with clear discharge and mild erythema.  Posterior pharynx with mild cobbling.  NT over frontal and maxillary sinuses. Neck:  Supple, No adenopathy, no thyromegaly Chest:  CTA CV: RRR without murmur or rub.  Radial and DP pulses normal and equal Abd:  S, NT, No HSM or mass , + BS LE: no edema.  Assessment & Plan   1.  GERD/IBS:  Switch from Pantoprazole once daily to Omeprazole 40 mg twice daily.  Okay to just continue with Papaya as long as this controls her bloating.  If not, to add fiber laxative gradually to daily treatment.   Looking into having at least her upper GI evaluated with GI.  2.  Seasonal allergies:  Start cetirizine and fluticasone nasal spray.    Spent 60 minutes face to face.  Many questions

## 2020-06-21 NOTE — Progress Notes (Signed)
Integrated Behavioral Health Comprehensive Clinical Assessment  MRN: [id number] Name: Denise Ruiz  Session Time: 10:35-11:35  Total time: 60  Type of Service: Integrated Behavioral Health-Individual Interpretor: No. Interpretor Name and Language: LCSW completed CCA in Spanish.   Presenting problem:  Patient is a 45 y/o Hispanic female. It should be noted patient was referred a couple months prior however patient wanted to wait for her active orange card due to cost. Patient presents today continued anxiety related symptoms such as difficulty with concentration. Patient reports increase of anxiety related symptoms due to her health of not knowing what she may have. Patient reports when she has stomach pain she begins to feel desperate and begins to think of what it may be. Patient reports a daughter has ongoing medical conditions since birth, when she says she feels sick or when they go to doctors offices she begins to feel anxious too. Patient reports some sadness when it comes to her parents. Patient reports she hasn't seen them over 22 years, and as they are getting older she hasn't had the ability to see them. Patient reports it brings her comfort being able to have her 45 year old over there currently visiting as they wanted to meet him. Patient reports she does talk to them often however she feels guility like she abandoned them when she left. Patient reports she has moments where she wants to return to her home country.    GAD7(16) BFX8(32)  Patient reports in the past she remembers feeling like she was suffocating.    Social History:  Who lives in your current household? Patient lives with family, including her husband of 20 years, 4 children (17, 24, 8 and 5) How do describe your family relationships? Good  What are your social supports? 2 sisters here in [place] and cousins What are your hobbies? Home, clean and walk Do you have any spiritual beliefs? Catholic  Education:   What is your highest level of education? high school diploma/GED.  Do you have history of developmental delays? No If currently in college or university, current major/program?  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 Current position:  cleaning                                How long have you worked for this employer? 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 In [place]  Does your employer have any American Disabilities Act accommodations for you?NA Current Military status (if applicable include dishonorable discharges and the reason):   Legal History Current/Past arrests, charges, incarcerations, etc: NA Current DSS/DHHS involvement, including foster care: NA Current DSS Case Worker name, phone number and email: NA Past DSS/DHHS involvement:  NA   Medical History:   has a past medical history of Bilateral ovarian cysts, Chronic sinusitis, Dyslipidemia, goal LDL below 130, GERD (gastroesophageal reflux disease), Hidradenitis axillaris, and Varicose vein.   Patient reports her health has been increasing her anxiety & stress and began to have anxious thinking "is there something wrong." Patient reports she thinks back to a friend who died of stomach cancer when she has stomach problems. Patient reports, "I don't know what to do."   Primary Care Physician: Julieanne Manson, MD Date of last physical exam: 06/18/20  Allergies:  Allergies  Allergen Reactions  . Kiwi Extract     Throat swells--does not happen all the time  . Soy Allergy Other (See Comments)    Throat swells   Current medications:  Outpatient Encounter Medications as of 06/18/2020  Medication Sig  . cetirizine (ZYRTEC ALLERGY) 10 MG tablet Take 1 tablet (10 mg total) by mouth daily.  . fluticasone (FLONASE) 50 MCG/ACT nasal spray Place 2 sprays into both nostrils daily. (Patient not taking: Reported on 06/18/2020)  .  omeprazole (PRILOSEC) 40 MG capsule 1 cap by mouth twice daily on empty stomach  . Thiamine HCl (VITAMIN B-1) 250 MG tablet Take 250 mg by mouth daily.   No facility-administered encounter medications on file as of 06/18/2020.   Have you ever had any serious medication reactions? No  Psychiatric History (mental health or substance abuse)  Have you ever been treated for a mental health/substance use problem? Negative If "Yes", when were you treated and whom did you see?  Dates from-Date to: Provider: Treatment Type: Outcome/Follow Up:  Family History: Is there any history of mental health problems or substance abuse in your family? Yes- Patient reports on her fathers side of the family 2 brothers "were crazy" and 1 cousin heard voices.  Has anyone in your family been hospitalized for mental health treatment? Yes- "a cousin"   Mental Status:  General appearance/Behavior: Neat Eye contact: Good Motor behavior: Normal Speech: Normal Level of consciousness: Alert Mood: Anxious Affect: Appropriate Thought process: Coherent Thought content: WNL Perception: Normal Judgment: Good Insight: Present Intellect:  WNL Memory: Within Normal limits Orientation: Full Orientated  Attention/Concentration Adequate  Comments:   Sleep Usual bedtime is 10p-12am, and wake up 6am  Sleeping arrangements: w/husband Problems with snoring: No Obstructive sleep apnea is not a concern. Problems with nightmares: No Problems with sleepwalking: No   Trauma History: Have you ever experienced or been exposed to any form of abuse?  Emotional? NA Physical? NA Sexual/assault? NA Neglect? NA Have you ever witnessed/been exposed to something traumatic? No; however reports when daughter was born it was traumatic to experience her multiple health conditions increasing her stress.   Do you have any current symptoms? Negative  Substance Abuse:  Do you use alcohol, nicotine or caffeine? NA Have you ever  used illicit drugs or abused prescription medications? NA If yes? Substance Type-  Route- Age of first use?  Amount of Use? Frequency? Last use?  Do you have any problems with the following symptoms?  Reason for use, any motivation to stop, what is stopping from use ? NA  Risk Assessment: Current danger to self Thoughts of suicide/death:  Self-harming behaviors:    Suicide attempt:  Has plan:    Comments/clarify:  Patient declined current      Past danger to self Thoughts of suicide/death:  Self-harming behaviors:    Suicide attempt:  Family history of suicide:    Comments/clarify: Patient declined past     Current danger to others Thoughts to harm others:  Plans to harm others:    Threats to harm others:  Attempt to harm others:    Comments/clarify: Patient declined current      Past danger to others Thoughts to harm others:  Plans to harm others:    Threats to harm others:  Attempt to harm others:    Comments/clarify: Patient declined past    RISK TO SELF Low to no risk: x Moderate risk:  Severe risk:   RISK TO OTHERS Low to  no risk: x Moderate risk:  Severe risk:     Do you have any protective factors that keep you from attempting? positive social support  Psychosocial strengths and stressors: Relationship support/concerns/needs: family support Financial concerns/needs: NA Financial resources (other sources of income, not from job): NA Housing concerns/needs: NA  Diagnosis   ICD-10-CM   1. Anxiety disorder, unspecified type  F41.9 presentations in which symptoms characteristic of a anxiety and related disorder that cause clinically significant distress or impairment in social, occupational, or other important areas of functioning predominate but do not meet full criteria for any of the disorders in the anxiety and related disorders diagnostic class.    Patient outcome?  What do you want out of treatment? "try to control my own thoughts and manage my anxiety." "to be  able to concentrate. I feel like I can't express myself when there's something I don't like, feels suffocating. Want to be able to act on the moment."  GOALS ADDRESSED: Patient will reduce symptoms of: anxiety and increase knowledge and/or ability of: coping skills.          INTERVENTIONS: Standardized Assessments completed: GAD-7 and PHQ 9 (16/17)  PLAN:  Based on screeners, presenting symptoms and diagnosis social worker is recommending therapy.  Scheduled next visit: it was rescheduled 04/13  Sherin Quarry P Robson Trickey Clinical Social Work

## 2020-06-25 ENCOUNTER — Other Ambulatory Visit: Payer: Self-pay | Admitting: Clinical

## 2020-07-07 ENCOUNTER — Ambulatory Visit (INDEPENDENT_AMBULATORY_CARE_PROVIDER_SITE_OTHER): Payer: Self-pay | Admitting: Clinical

## 2020-07-07 DIAGNOSIS — F419 Anxiety disorder, unspecified: Secondary | ICD-10-CM

## 2020-07-16 NOTE — Progress Notes (Signed)
   THERAPY PROGRESS NOTE  Session Time: 10-11am Participation Level: Active Behavioral Response: CasualAlertEuthymic Type of Therapy: Individual Therapy Treatment Goals addressed: Patient will reduce symptoms of: anxiety and increase knowledge and/or ability of: coping skills.     Purpose: LCSW met with client for routine individual therapy to work towards treatment goals: reducing anxiety symptoms and increase coping skills.   Intervention: LCSW met with patient for first routine individual session to work treatment goals. LCSW provided patient opportunity to check in to assess any recent symptoms and how she has been since initial assessment. LCSW utilized this to get to know patient further aside from initial assessment. In this session LCSW discussed mindfulness of focusing away from the pain it may present that could increase her anxiety. LCSW assessed for SI/HI/command psychosis.  Effectiveness: Patient is alert x4 affect. Patient reported during check in she had just came back from the gym after dropping off her children. Patient reports she tries her best to give time for herself in between her errands and such. Patient identified she is doing good she just came back from visiting [place] where she is from. Patient shared how she has family and friends are there and it was nice to them. Patient participated in session a lot of current anxiety stems from her current health concerns. Patient reports she ended up going another location where she was given a diagnosis but returning to PCP learned that was not the appropriate way to diagnose that specific thing and wonders if the medication she was taking made her health worse. Patient shared becoming impatient not hearing back from the referral location, shared if she knew a date or how long would probably ease.LCSW and patient discussed mindfulness of focusing away from the uncomfortable feeling by mindfully focusing on her surroundings of what  is present around her to help cope with any pain/uncomfortable sensations. Patient presented understanding of this perspective and shared she already tries to do this on her own.   Patient reports to LCSW if able to check with PCP to schedule a lab to explore if any other health concerns are present via blood and a referral to dentist/eye. Intervention was effective as patient was able to reflect, participated in the session and able to describe and explore where her anxiety is currently stemming from. Progress towards goal is Ongoing. Patient denied active suicidal/homicidal/active psychosis.  Plan Patient offered next appointment for: 04/28 9:30am (will need to reschedule due to same time with PCP)  Diagnosis: Unspecified anxiety    Lujean Rave, LCSW 07/16/2020

## 2020-07-22 ENCOUNTER — Other Ambulatory Visit: Payer: Self-pay | Admitting: Clinical

## 2020-07-22 ENCOUNTER — Ambulatory Visit: Payer: Self-pay | Admitting: Internal Medicine

## 2020-07-22 ENCOUNTER — Other Ambulatory Visit: Payer: Self-pay

## 2020-07-22 ENCOUNTER — Encounter: Payer: Self-pay | Admitting: Internal Medicine

## 2020-07-22 VITALS — BP 128/64 | HR 76 | Resp 12 | Ht 58.5 in | Wt 121.0 lb

## 2020-07-22 DIAGNOSIS — F419 Anxiety disorder, unspecified: Secondary | ICD-10-CM

## 2020-07-22 DIAGNOSIS — H547 Unspecified visual loss: Secondary | ICD-10-CM

## 2020-07-22 DIAGNOSIS — J3089 Other allergic rhinitis: Secondary | ICD-10-CM

## 2020-07-22 DIAGNOSIS — K588 Other irritable bowel syndrome: Secondary | ICD-10-CM

## 2020-07-22 DIAGNOSIS — K219 Gastro-esophageal reflux disease without esophagitis: Secondary | ICD-10-CM

## 2020-07-22 DIAGNOSIS — Z23 Encounter for immunization: Secondary | ICD-10-CM

## 2020-07-22 DIAGNOSIS — Z9189 Other specified personal risk factors, not elsewhere classified: Secondary | ICD-10-CM

## 2020-07-22 NOTE — Progress Notes (Signed)
Subjective:    Patient ID: Denise Ruiz, female   DOB: December 01, 1975, 45 y.o.   MRN: [id number]   HPI   1.  GERD/IBS:  Still utilizing papaya for her increased fiber intake for IBS.  She is better, but still with bloating and gas.   She has been called for appt with GI for next week. Regarding GERD symptoms:  She is taking Omeprazole 40 mg twice daily for past month in a switch from Pantoprazole.  Her symptoms of epigastric burning and burning in chest and throat is a bit better, but now with a bitter taste when she is late to eat or misses a meal.   No melena or hematochezia.   Brings up difficulty swallowing at end of visit--not definitively   2.  Allergies:  Using Cetirizine and fluticasone nasal spray daily and feels her headache, itchiness of throat and nose and runny nose improved.  3.  Needs dental referral for dental care.  4.  Decreased visual acuity:  Does use reading glasses for near vision.  She does not see clearly also with distance vision.  5.  Fatigue:  For past 2 months.  Feels her anxiety is maybe a bit better.  Working with Danton Clap, LCSW.   She has had normal CBC, CMP, TSH in past 6-12 months.  Hands and feet get very cold.    Current Meds  Medication Sig  . cetirizine (ZYRTEC ALLERGY) 10 MG tablet Take 1 tablet (10 mg total) by mouth daily.  . fluticasone (FLONASE) 50 MCG/ACT nasal spray Place 2 sprays into both nostrils daily.  Marland Kitchen omeprazole (PRILOSEC) 40 MG capsule 1 cap by mouth twice daily on empty stomach  . Thiamine HCl (VITAMIN B-1) 250 MG tablet Take 250 mg by mouth daily.   Allergies  Allergen Reactions  . Kiwi Extract     Throat swells--does not happen all the time  . Soy Allergy Other (See Comments)    Throat swells     Review of Systems    Objective:   BP 128/64 (BP Location: Left Arm, Patient Position: Sitting, Cuff Size: Normal)   Pulse 76   Resp 12   Ht 4' 10.5" (1.486 m)   Wt 121 lb (54.9 kg)   LMP 07/12/2020 (Exact  Date)   BMI 24.86 kg/m   Physical Exam  NAD HEENT:  PERRL, EOMI conjunctivae without injection.  Palpebral conjunctivae with good pink-red coloration.  Nasal mucosa a bit boggy with clear discharge.  Throat without injection Neck:  Supple, No adenopathy, no thyromegaly Chest:  CTA CV:  RRR with normal S1 and S2, No S3, S4 or murmur.  Radial and DP pulses normal and equal Abd:  S, NT, No HSM or mass, + BS LE:  No edema. Good palmar coloration--no paleness.   Assessment & Plan   1.  GERD/IBS:  Maybe a bit better with switch of PPI and increase to twice daily.  New symptom of dysphagia.  GI to evaluate next week. She did start nasal fluticasone in March--may be having some side effects from that with throat symptoms--again, hope to visualize upper GI with referral next week. CBC, CMP normal in November  2.  Fatigue:  Not clear how much of her anxiety is part of this.  Her CBC, CMP, TSH all fine in past 5-12 months.  No signs of anemia today.    3.  Anxiety:  To continue to work with Danton Clap, LCSW    4.  Seasonal allergies:  Improved with treatment.  5.  Need for dental care:  Referral  6.  Decreased visual acuity:  Referral to Optometry  7.  Need for Moderna COVID booster:  She has a mammogram scheduled for May 19th, so will need to come in afterward for vaccination.

## 2020-07-28 ENCOUNTER — Other Ambulatory Visit: Payer: Self-pay

## 2020-07-28 ENCOUNTER — Ambulatory Visit: Payer: Self-pay | Admitting: Clinical

## 2020-07-28 DIAGNOSIS — F419 Anxiety disorder, unspecified: Secondary | ICD-10-CM

## 2020-07-29 ENCOUNTER — Other Ambulatory Visit: Payer: Self-pay | Admitting: Gastroenterology

## 2020-08-04 NOTE — Progress Notes (Signed)
   THERAPY PROGRESS NOTE  Session Time: 4-5p Participation Level: Active Behavioral Response: CasualAlertEuthymic Type of Therapy: Individual Therapy Treatment Goals addressed: Patient will reduce symptoms [id number] increase knowledge and/or ability of: coping skills.   Purpose: LCSW met with client for routine individual therapy to work towards treatment goals: Patient will reduce symptoms [id number] increase knowledge and/or ability of: coping skills.  Intervention: LCSW met with client for routine individual therapy to work towards treatment goal. LCSW provided patient opportunity to check in to assess for any significant events and how she is doing today. LCSW utilized intervention of psychotherapy to work towards continued decrease in anxiety as patient processed subjects that are of stressors health. LCSW provided reflective listening as patient processed her feelings of sadness being separated from her parents.     LCSW assessed for SI/HI/command psychosis.  Effectiveness: Patient is alert x4 affect. Patient reports today she is good, is happy to have a scheduled appointment with the specialist. Patient shared gratitude of the idea to contact [facility] to find out where she might've gotten referred. Patient reports she is happy to finally just have an appointment as her anxiety was increasing not know when or where her appointment would be. Patient reports just having this first appointment set is a relief. LCSW praised patient with following with her plan of contacting with [facility]. LCSW discussed with patient of plan to prepare for her session by grounding herself in the waiting room, looking at the details of the room and reaffirming herself this is a start. Patient expressed understanding of a plan to follow. Patient participated in session sharing continued feeling of sadness of being separated from her parents. Patient shared it triggered more within the last few years following a  health scare of her parents, patient reports unfortunately due to the pandemic their appointment for application for visa was pushed until 2023. Patient reports unfortunately she is not able to apply herself at this time. Patient reports it feels like a part of her heart is back in [place] and the other half here with her family, feels conflicted as she gets moments when she wants to return to [facility]. Patient reports what helped some was having her youngest meet her parents. Patient reports she does maintain a relationship with her parents via phone or video call. LCSW and patient discussed practicing gratitude of having this technology present to communicate with her parents. Discussed of looking forward to seeing them in person, of that moment, as this could help with motivation and coping with the sadness.  Intervention was effective as patient processed her feelings and able to accept techniques of approach to practice. Progress towards goal is Ongoing. Patient denied active suicidal/homicidal/active psychosis.  Plan Patient offered next appointment for: 05/18 4pm  Diagnosis: unspecified anxiety disorder    Lujean Rave, LCSW 08/04/2020

## 2020-08-11 ENCOUNTER — Other Ambulatory Visit: Payer: Self-pay

## 2020-08-11 ENCOUNTER — Ambulatory Visit: Payer: Self-pay | Admitting: Clinical

## 2020-08-11 DIAGNOSIS — F419 Anxiety disorder, unspecified: Secondary | ICD-10-CM

## 2020-08-12 ENCOUNTER — Ambulatory Visit: Payer: Self-pay | Admitting: *Deleted

## 2020-08-12 ENCOUNTER — Other Ambulatory Visit: Payer: Self-pay

## 2020-08-12 ENCOUNTER — Ambulatory Visit: Payer: No Typology Code available for payment source

## 2020-08-12 VITALS — BP 100/68 | Wt 125.7 lb

## 2020-08-12 DIAGNOSIS — Z1239 Encounter for other screening for malignant neoplasm of breast: Secondary | ICD-10-CM

## 2020-08-12 DIAGNOSIS — R2231 Localized swelling, mass and lump, right upper limb: Secondary | ICD-10-CM

## 2020-08-12 DIAGNOSIS — R2232 Localized swelling, mass and lump, left upper limb: Secondary | ICD-10-CM

## 2020-08-12 NOTE — Patient Instructions (Signed)
Explained breast self awareness with [name]. Patient did not need a Pap smear today due to last Pap smear was in 04/08/2020. Let her know [facility] will cover Pap smears every 3 years unless has a history of abnormal Pap smears. Referred [facility] to [facility] for a diagnostic mammogram. Appointment scheduled Thursday, September 02, 2020 at 0940. Patient aware of appointment and will be there. Denise Ruiz verbalized understanding.  Denise Ruiz, Denise Maser, RN 9:54 AM

## 2020-08-12 NOTE — Progress Notes (Signed)
[name] is a 45 y.o. female who presents to [facility] clinic today with complaint of small left axillary lump and left axillary pain. Patient states the pain is intermittent. Patient rates the pain at a 3 out of 10. Since 06/03/2013 patient has complained of left axillary lump and intermittent left breast pain. See below imaging for details.   Pap Smear: Pap smear not completed today. Last Pap smear was 04/08/2020 at [facility] clinic and was normal. Per patient has no history of an abnormal Pap smear. Last Pap smear result is available in Epic.   Physical exam: Breasts Breasts symmetrical. No skin abnormalities bilateral breasts. No nipple retraction bilateral breasts. No nipple discharge bilateral breasts. No lymphadenopathy. Palpated a lump within the right axilla at 10:30 o'clock 9 cm from the nipple that is consistent with previous finding 01/03/2018. Palpated a lump within the left axilla at 1 o'clock 10 cm from the nipple. Complaints of tenderness when palpated bilateral axillary lumps.     MM DIAG BREAST TOMO UNI RIGHT  Result Date: 01/03/2018 CLINICAL DATA:  Palpable lump in the RIGHT axilla for 2 weeks. EXAM: DIGITAL DIAGNOSTIC RIGHT MAMMOGRAM WITH CAD AND TOMO ULTRASOUND RIGHT BREAST COMPARISON:  Previous exams including bilateral diagnostic mammogram dated 03/08/2017. ACR Breast Density Category c: The breast tissue is heterogeneously dense, which may obscure small masses. FINDINGS: There are no new dominant masses, suspicious calcifications or secondary signs of malignancy within the RIGHT breast. There is a partially obscured mass within the RIGHT axilla, at superficial depth (possibly within the skin), corresponding to the area of clinical concern, with overlying skin markers in place. Mildly prominent lymph node noted within the deeper aspects of the RIGHT axilla, likely corresponding as an incidental finding. Mammographic images were processed with CAD. Targeted ultrasound  is performed, showing a single contiguous complicated sebaceous cyst within the skin of the RIGHT axilla, measuring 3.3 cm long axis and 0.9 cm thickness, corresponding to the sites of palpable lumps. No extension of the collection into the underlying soft tissues. No solid or cystic mass. No enlarged or morphologically abnormal lymph nodes within the RIGHT axilla. IMPRESSION: 1. Complicated sebaceous cyst within the skin of the RIGHT axilla, with internal debris, measuring 3.3 cm long axis and 0.9 cm thickness, corresponding to the area of clinical concern in the RIGHT axilla. 2. No evidence of malignancy within the RIGHT breast or RIGHT axilla. RECOMMENDATION: 1. Clinical follow-up. Patient was informed that sebaceous cysts are benign and typically self-limiting. Patient was instructed to use warm compresses to expedite resolution. Patient was encouraged to follow-up with referring physician if the palpable findings increased in size/extent or if any surrounding skin redness or warmth developed. If associated clinical findings increase/worsen, would consider surgical consultation for possible excision. 2. Annual screening mammograms. Next LEFT breast screening mammogram will be due in December of 2019. I have discussed the findings and recommendations with the patient. Results were also provided in writing at the conclusion of the visit. If applicable, a reminder letter will be sent to the patient regarding the next appointment. BI-RADS CATEGORY  2: Benign. Electronically Signed   By: Bary Richard M.D.   On: 01/03/2018 15:21   MM DIAG BREAST TOMO BILATERAL  Addendum Date: 03/08/2017   ADDENDUM REPORT: 03/08/2017 14:01 ADDENDUM: Recommendation: Screening mammogram in one year.(Code:SM-B-01Y) Electronically Signed   By: Annia Belt M.D.   On: 03/08/2017 14:01   Result Date: 03/08/2017 CLINICAL DATA:  Patient presents for palpable abnormality within the  left axilla. EXAM: 2D DIGITAL DIAGNOSTIC BILATERAL  MAMMOGRAM WITH CAD AND ADJUNCT TOMO ULTRASOUND LEFT BREAST COMPARISON:  Previous exam(s). ACR Breast Density Category c: The breast tissue is heterogeneously dense, which may obscure small masses. FINDINGS: No concerning masses, calcifications or distortion identified within either breast. No discrete abnormality identified within the left axilla at the site of palpable abnormality. Mammographic images were processed with CAD. On physical exam, I palpate no discrete mass within the left axilla. Targeted ultrasound is performed, showing normal axillary contents without suspicious mass within the left axilla. IMPRESSION: No mammographic evidence for malignancy. RECOMMENDATION: Continued clinical evaluation for reported left axillary palpable abnormality. No mammographic evidence for malignancy. I have discussed the findings and recommendations with the patient. Results were also provided in writing at the conclusion of the visit. If applicable, a reminder letter will be sent to the patient regarding the next appointment. BI-RADS CATEGORY  1: Negative. Electronically Signed: By: Annia Belt M.D. On: 03/08/2017 09:53   MS DIGITAL SCREENING TOMO BILATERAL  Result Date: 09/09/2018 CLINICAL DATA:  Screening. EXAM: DIGITAL SCREENING BILATERAL MAMMOGRAM WITH TOMO AND CAD COMPARISON:  Previous exam(s). ACR Breast Density Category d: The breast tissue is extremely dense, which lowers the sensitivity of mammography FINDINGS: There are no findings suspicious for malignancy. Images were processed with CAD. IMPRESSION: No mammographic evidence of malignancy. A result letter of this screening mammogram will be mailed directly to the patient. RECOMMENDATION: Screening mammogram in one year. (Code:SM-B-01Y) BI-RADS CATEGORY  1: Negative. Electronically Signed   By: Beckie Salts M.D.   On: 09/09/2018 07:50   MS DIGITAL DIAG TOMO UNI LEFT  Result Date: 08/05/2019 CLINICAL DATA:  45 year old female with tenderness/burning in the  left axillary region. EXAM: DIGITAL DIAGNOSTIC UNILATERAL LEFT MAMMOGRAM WITH CAD AND TOMO ULTRASOUND LEFT AXILLA COMPARISON:  PREVIOUS EXAMS. ACR Breast Density Category c: The breast tissue is heterogeneously dense, which may obscure small masses. FINDINGS: No suspicious masses or calcifications are seen in either breast. Spot compression tangential tomograms were performed over the area of concern in the left axilla with no definite abnormality seen. Mammographic images were processed with CAD. Physical examination of the left axilla does not reveal any palpable masses. The patient is very mildly tender to palpation of the axillary tail region. Targeted ultrasound of the left axilla in region of tenderness/burning sensation was performed. No suspicious masses or abnormality seen. No morphologically abnormal lymph nodes identified. IMPRESSION: No mammographic or sonographic abnormalities to account for left axillary tenderness/burning sensation. RECOMMENDATION: 1. Recommend further management of left axillary tenderness/burning sensation be based on clinical assessment. 2.  Recommend annual routine screening mammography, due June 2021. I have discussed the findings and recommendations with the patient. If applicable, a reminder letter will be sent to the patient regarding the next appointment. BI-RADS CATEGORY  1: Negative. Electronically Signed   By: Edwin Cap M.D.   On: 08/05/2019 12:04   Pelvic/Bimanual Pap is not indicated today per BCCCP guidelines.   Smoking History: Patient has never smoked.   Patient Navigation: Patient education provided. Access to services provided for patient through [employer] program. Used Spanish interpreter Natale Lay from [facility] provided.    Breast and Cervical Cancer Risk Assessment: Patient has family history of three paternal first cousins having breast cancer. Patient has no known genetic mutations or history of radiation treatment to the chest before age 34.  Patient does not have history of cervical dysplasia, immunocompromised, or DES exposure in-utero.  Risk Assessment  Risk Scores      08/12/2020 08/05/2019   Last edited by: Narda Rutherford, LPN McGill, Sherie Demetrius Charity, LPN   5-year risk: 0.6 % 0.6 %   Lifetime risk: 8.3 % 8.4 %          A: BCCCP exam without pap smear Complaint of left axillary pain and lumps.  P: Referred patient to [facility] for a diagnostic mammogram. Appointment scheduled Thursday, September 02, 2020 at 0940.  Priscille Heidelberg, RN 08/12/2020 9:54 AM

## 2020-08-13 NOTE — Progress Notes (Signed)
   THERAPY PROGRESS NOTE  Session Time: 4-5pm Participation Level: Active Behavioral Response: CasualAlertEuthymic Type of Therapy: Individual Therapy Treatment Goals addressed: Patient will reduce symptoms [id number] increase knowledge and/or ability of: coping skills.   Purpose: LCSW met with client for routine individual therapy to work towards treatment goals: reduce symptoms [id number] increase knowledge and/or ability of: coping skills.  Intervention: LCSW met with patient for routine individual therapy to continue to work towards treatment goals. LCSW provided patient opportunity to check in to assess for any significant events and how she is doing today. LCSW provided reflective listening as patient processed recent updates regarding her health and thoughts. LCSW praised patient how she has taken initiative to manage her symptoms and accepted her anxiety. Patient processed she wants to learn how maintain accountability on her house rules with her kids, LCSW brainstormed with patient ideas how to make house rules fun where her children could be motivated. LCSW assessed for SI/HI/command psychosis.  Effectiveness: Patient is alert x4 affect. Patient reports today she is somewhat okay. Patient provided update of appointment with specialist, reports they have scheduled a endoscopy to help identify definite answer, however he did share what it could possibly be. Patient reports a part of relief to finally be seen and have a plan to treat what she may have. Patient processed with taking initiative of calling [facility] to ask she would've been left with uncertainty. Patient reports this help her see perspective on problem solving and finding coping skills to manage the anxiety. Patient processed in session what has been on her mind is not maintaining accountability on her house rules with her children, reports she asks them to each clean their bedroom, and when they don't do them she ends up  doing it herself. Patient accepted strategies of making it fun such as planning a day with all family members clean their rooms and as a reward go out as a family to the park, ice cream or whatever it may be. Patient reports she wants to maintain accountability for her children to learn organizational skills and take care of their things. Patient reports she talks to her children about her experiences and hopes her children take away something. Patient reports she is excited for her daughter who is graduating next month. Patient reports she found today's session as she was able to process and express her thoughts from another perspective. Intervention was effective as patient was able to reflect and process her thoughts. Progress towards goal is Ongoing. Patient denied active suicidal/homicidal/active psychosis.  Plan Patient offered next appointment for: 06/01 4pm   Diagnosis: Unspecified anxiety disorder    Lujean Rave, LCSW 08/13/2020

## 2020-08-25 ENCOUNTER — Other Ambulatory Visit: Payer: Self-pay | Admitting: Clinical

## 2020-08-30 ENCOUNTER — Other Ambulatory Visit: Payer: Self-pay

## 2020-08-30 ENCOUNTER — Encounter (HOSPITAL_COMMUNITY)
Admission: RE | Disposition: A | Discharge status: Home / Self Care | Payer: Self-pay | Source: Home / Self Care | Attending: Gastroenterology

## 2020-08-30 ENCOUNTER — Ambulatory Visit (HOSPITAL_COMMUNITY)
Admission: RE | Admit: 2020-08-30 | Discharge: 2020-08-30 | Disposition: A | Discharge status: Home / Self Care | Payer: No Typology Code available for payment source | Attending: Gastroenterology | Admitting: Gastroenterology

## 2020-08-30 ENCOUNTER — Encounter (HOSPITAL_COMMUNITY): Payer: Self-pay | Admitting: Gastroenterology

## 2020-08-30 ENCOUNTER — Ambulatory Visit (HOSPITAL_COMMUNITY): Payer: No Typology Code available for payment source | Admitting: Certified Registered Nurse Anesthetist

## 2020-08-30 DIAGNOSIS — R1310 Dysphagia, unspecified: Secondary | ICD-10-CM | POA: Insufficient documentation

## 2020-08-30 DIAGNOSIS — K2950 Unspecified chronic gastritis without bleeding: Secondary | ICD-10-CM | POA: Insufficient documentation

## 2020-08-30 DIAGNOSIS — K449 Diaphragmatic hernia without obstruction or gangrene: Secondary | ICD-10-CM | POA: Insufficient documentation

## 2020-08-30 DIAGNOSIS — Z8619 Personal history of other infectious and parasitic diseases: Secondary | ICD-10-CM | POA: Insufficient documentation

## 2020-08-30 HISTORY — PX: ESOPHAGOGASTRODUODENOSCOPY (EGD) WITH PROPOFOL: SHX5813

## 2020-08-30 HISTORY — PX: BIOPSY: SHX5522

## 2020-08-30 SURGERY — ESOPHAGOGASTRODUODENOSCOPY (EGD) WITH PROPOFOL
Anesthesia: Monitor Anesthesia Care

## 2020-08-30 MED ORDER — SODIUM CHLORIDE 0.9 % IV SOLN: INTRAVENOUS | Status: DC

## 2020-08-30 MED ORDER — PROPOFOL 10 MG/ML IV BOLUS
INTRAVENOUS | Status: DC | PRN
  Administered 2020-08-30: 50 mg via INTRAVENOUS

## 2020-08-30 MED ORDER — PROPOFOL 500 MG/50ML IV EMUL
INTRAVENOUS | Status: DC | PRN
  Administered 2020-08-30: 125 ug/kg/min via INTRAVENOUS

## 2020-08-30 MED ORDER — LACTATED RINGERS IV SOLN: Freq: Once | INTRAVENOUS | Status: AC

## 2020-08-30 MED ORDER — LIDOCAINE 2% (20 MG/ML) 5 ML SYRINGE
INTRAMUSCULAR | Status: DC | PRN
  Administered 2020-08-30: 50 mg via INTRAVENOUS

## 2020-08-30 MED ORDER — PROPOFOL 500 MG/50ML IV EMUL
INTRAVENOUS | Status: AC
  Filled 2020-08-30: qty 50

## 2020-08-30 MED ORDER — LACTATED RINGERS IV SOLN: INTRAVENOUS | Status: DC | PRN

## 2020-08-30 SURGICAL SUPPLY — 14 items

## 2020-08-30 NOTE — Anesthesia Postprocedure Evaluation (Signed)
Anesthesia Post Note  Patient: Geneticist, molecular  Procedure(s) Performed: ESOPHAGOGASTRODUODENOSCOPY (EGD) WITH PROPOFOL (N/A ) BIOPSY     Patient location during evaluation: PACU Anesthesia Type: MAC Level of consciousness: awake and alert Pain management: pain level controlled Vital Signs Assessment: post-procedure vital signs reviewed and stable Respiratory status: spontaneous breathing, nonlabored ventilation, respiratory function stable and patient connected to nasal cannula oxygen Cardiovascular status: stable and blood pressure returned to baseline Postop Assessment: no apparent nausea or vomiting Anesthetic complications: no   No complications documented.  Last Vitals:  Vitals:   08/30/20 1050 08/30/20 1055  BP: 119/73   Pulse: (!) 51 (!) 59  Resp: 14 15  Temp:    SpO2: 99% 100%    Last Pain:  Vitals:   08/30/20 1040  TempSrc:   PainSc: 0-No pain                 Effie Berkshire

## 2020-08-30 NOTE — Op Note (Addendum)
[facility] Patient Name: Denise Ruiz Procedure Date: 08/30/2020 MRN: [id number] Attending MD: Vida Rigger , MD Date of Birth: [birth year] CSN: [id number] Age: 45 Admit Type: Outpatient Procedure:                Upper GI endoscopy Indications:              Dysphagia, history of H. pylori Providers:                Vida Rigger, MD, Blenda Mounts, RN, Arlee Muslim                            Tech., Technician, Nadene Rubins Referring MD:              Medicines:                Propofol total dose 150 mg IV, 50 mg IV lidocaine Complications:            No immediate complications. Estimated Blood Loss:     Estimated blood loss: none. Procedure:                Pre-Anesthesia Assessment:                           - Prior to the procedure, a History and Physical                            was performed, and patient medications and                            allergies were reviewed. The patient's tolerance of                            previous anesthesia was also reviewed. The risks                            and benefits of the procedure and the sedation                            options and risks were discussed with the patient.                            All questions were answered, and informed consent                            was obtained. Prior Anticoagulants: The patient has                            taken no previous anticoagulant or antiplatelet                            agents. ASA Grade Assessment: I - A normal, healthy                            patient. After reviewing the risks and benefits,  the patient was deemed in satisfactory condition to                            undergo the procedure.                           After obtaining informed consent, the endoscope was                            passed under direct vision. Throughout the                            procedure, the patient's blood pressure, pulse, and                             oxygen saturations were monitored continuously. The                            GIF-H190 ([id number] Olympus gastroscope was                            introduced through the mouth, and advanced to the                            second part of duodenum. The upper GI endoscopy was                            accomplished without difficulty. The patient                            tolerated the procedure well. Scope In: Scope Out: Findings:      A Tiny hiatal hernia was present.      The entire examined stomach was normal. Biopsies were taken with a cold       forceps for histology and rule out H. pylori.      The ampulla, duodenal bulb, first portion of the duodenum, second       portion of the duodenum, major papilla and area of the papilla were       normal.      The exam was otherwise without abnormality. Impression:               - Tiny hiatal hernia.                           - Normal stomach. Biopsied.                           - Normal ampulla, duodenal bulb, first portion of                            the duodenum, second portion of the duodenum, major                            papilla and area of the papilla.                           -  The examination was otherwise normal. Moderate Sedation:      Not Applicable - Patient had care per Anesthesia. Recommendation:           - Patient has a contact number available for                            emergencies. The signs and symptoms of potential                            delayed complications were discussed with the                            patient. Return to normal activities tomorrow.                            Written discharge instructions were provided to the                            patient.                           - Soft diet today.                           - Continue present medications.                           - Await pathology results.                           - Return to GI clinic PRN.                            - Telephone GI clinic for pathology results in 1                            week.                           - Telephone GI clinic if symptomatic PRN. Procedure Code(s):        --- Professional ---                           [phone], Esophagogastroduodenoscopy, flexible,                            transoral; with biopsy, single or multiple Diagnosis Code(s):        --- Professional ---                           K44.9, Diaphragmatic hernia without obstruction or                            gangrene                           R13.10, Dysphagia, unspecified CPT copyright 2019 American Medical  Association. All rights reserved. The codes documented in this report are preliminary and upon coder review may  be revised to meet current compliance requirements. Vida Rigger, MD 08/30/2020 10:43:40 AM This report has been signed electronically. Number of Addenda: 0

## 2020-08-30 NOTE — Transfer of Care (Signed)
Immediate Anesthesia Transfer of Care Note  Patient: Denise Ruiz  Procedure(s) Performed: ESOPHAGOGASTRODUODENOSCOPY (EGD) WITH PROPOFOL (N/A ) BIOPSY  Patient Location: PACU  Anesthesia Type:MAC  Level of Consciousness: drowsy  Airway & Oxygen Therapy: Patient Spontanous Breathing and Patient connected to face mask  Post-op Assessment: Report given to RN and Post -op Vital signs reviewed and stable  Post vital signs: Reviewed and stable  Last Vitals:  Vitals Value Taken Time  BP    Temp    Pulse 54 08/30/20 1015  Resp 22 08/30/20 1015  SpO2 100 % 08/30/20 1015  Vitals shown include unvalidated device data.  Last Pain:  Vitals:   08/30/20 0917  TempSrc: Oral  PainSc: 0-No pain         Complications: No complications documented.

## 2020-08-30 NOTE — Progress Notes (Signed)
Denise Ruiz 9:58 AM  Subjective: Patient doing better since we saw her recently and the office and is not having swallowing problems on her medicine and has no new complaints  Objective: Vital signs stable afebrile exam please see preassessment evaluation  Assessment: Dysphagia history of H. pylori and improved symptoms  Plan: Okay to proceed with endoscopy with anesthesia assistance  [facility]  office [phone] After 5PM or if no answer call [phone]

## 2020-08-30 NOTE — Progress Notes (Signed)
Interpreter called spouse to let him know that pt is ready to go and driver should meet [place] at front of hospital

## 2020-08-30 NOTE — Discharge Instructions (Signed)
YOU HAD AN ENDOSCOPIC PROCEDURE TODAY: Refer to the procedure report and other information in the discharge instructions given to you for any specific questions about what was found during the examination. If this information does not answer your questions, please call [facility] office at [phone] to clarify.   YOU SHOULD EXPECT: Some feelings of bloating in the abdomen. Passage of more gas than usual. Walking can help get rid of the air that was put into your GI tract during the procedure and reduce the bloating. If you had a lower endoscopy (such as a colonoscopy or flexible sigmoidoscopy) you may notice spotting of blood in your stool or on the toilet paper. Some abdominal soreness may be present for a day or two, also.  DIET: Your first meal following the procedure should be a light meal and then it is ok to progress to your normal diet. A half-sandwich or bowl of soup is an example of a good first meal. Heavy or fried foods are harder to digest and may make you feel nauseous or bloated. Drink plenty of fluids but you should avoid alcoholic beverages for 24 hours. If you had a esophageal dilation, please see attached instructions for diet.    ACTIVITY: Your care partner should take you home directly after the procedure. You should plan to take it easy, moving slowly for the rest of the day. You can resume normal activity the day after the procedure however YOU SHOULD NOT DRIVE, use power tools, machinery or perform tasks that involve climbing or major physical exertion for 24 hours (because of the sedation medicines used during the test).   SYMPTOMS TO REPORT IMMEDIATELY: A gastroenterologist can be reached at any hour. Please call [phone]  for any of the following symptoms:  . Following lower endoscopy (colonoscopy, flexible sigmoidoscopy) Excessive amounts of blood in the stool  Significant tenderness, worsening of abdominal pains  Swelling of the abdomen that is new, acute  Fever of 100  or higher  . Following upper endoscopy (EGD, EUS, ERCP, esophageal dilation) Vomiting of blood or coffee ground material  New, significant abdominal pain  New, significant chest pain or pain under the shoulder blades  Painful or persistently difficult swallowing  New shortness of breath  Black, tarry-looking or red, bloody stools  FOLLOW UP:  If any biopsies were taken you will be contacted by phone or by letter within the next 1-3 weeks. Call [phone]  if you have not heard about the biopsies in 3 weeks.  Please also call with any specific questions about appointments or follow up tests. YOU HAD AN ENDOSCOPIC PROCEDURE TODAY: Refer to the procedure report and other information in the discharge instructions given to you for any specific questions about what was found during the examination. If this information does not answer your questions, please call [facility] office at [phone] to clarify.   YOU SHOULD EXPECT: Some feelings of bloating in the abdomen. Passage of more gas than usual. Walking can help get rid of the air that was put into your GI tract during the procedure and reduce the bloating. If you had a lower endoscopy (such as a colonoscopy or flexible sigmoidoscopy) you may notice spotting of blood in your stool or on the toilet paper. Some abdominal soreness may be present for a day or two, also.  DIET: Your first meal following the procedure should be a light meal and then it is ok to progress to your normal diet. A half-sandwich or bowl of soup   is an example of a good first meal. Heavy or fried foods are harder to digest and may make you feel nauseous or bloated. Drink plenty of fluids but you should avoid alcoholic beverages for 24 hours. If you had a esophageal dilation, please see attached instructions for diet.    ACTIVITY: Your care partner should take you home directly after the procedure. You should plan to take it easy, moving slowly for the rest of the day. You can  resume normal activity the day after the procedure however YOU SHOULD NOT DRIVE, use power tools, machinery or perform tasks that involve climbing or major physical exertion for 24 hours (because of the sedation medicines used during the test).   SYMPTOMS TO REPORT IMMEDIATELY: A gastroenterologist can be reached at any hour. Please call [phone] biopsy any of the following symptoms:  . Following upper endoscopy (EGD, EUS, ERCP, esophageal dilation) Vomiting of blood or coffee ground material  New, significant abdominal pain  New, significant chest pain or pain under the shoulder blades  Painful or persistently difficult swallowing  New shortness of breath  Black, tarry-looking or red, bloody stools  FOLLOW UP:  If any biopsies were taken you will be contacted by phone or by letter within the next 1-3 weeks. Call [phone]  if you have not heard about the biopsies in 3 weeks.  Please also call with any specific questions about appointments or follow up tests. Call if question or problem otherwise call for your  biopsy report in 1week and will discuss follow-up at that point based on how you are doing

## 2020-08-30 NOTE — Anesthesia Preprocedure Evaluation (Signed)
Anesthesia Evaluation  Patient identified by MRN, date of birth, ID band Patient awake    Reviewed: Allergy & Precautions, NPO status , Patient's Chart, lab work & pertinent test results  Airway Mallampati: II  TM Distance: >3 FB Neck ROM: Full    Dental no notable dental hx.    Pulmonary neg pulmonary ROS,    Pulmonary exam normal        Cardiovascular negative cardio ROS Normal cardiovascular exam     Neuro/Psych negative neurological ROS  negative psych ROS   GI/Hepatic Neg liver ROS, GERD  ,  Endo/Other  negative endocrine ROS  Renal/GU negative Renal ROS     Musculoskeletal negative musculoskeletal ROS (+)   Abdominal Normal abdominal exam  (+)   Peds  Hematology negative hematology ROS (+)   Anesthesia Other Findings   Reproductive/Obstetrics                             Anesthesia Physical Anesthesia Plan  ASA: II  Anesthesia Plan: MAC   Post-op Pain Management:    Induction: Intravenous  PONV Risk Score and Plan: 0 and Propofol infusion  Airway Management Planned: Natural Airway and Simple Face Mask  Additional Equipment: None  Intra-op Plan:   Post-operative Plan:   Informed Consent: I have reviewed the patients History and Physical, chart, labs and discussed the procedure including the risks, benefits and alternatives for the proposed anesthesia with the patient or authorized representative who has indicated his/her understanding and acceptance.       Plan Discussed with: CRNA  Anesthesia Plan Comments:         Anesthesia Quick Evaluation

## 2020-08-31 LAB — SURGICAL PATHOLOGY

## 2020-09-01 ENCOUNTER — Encounter (HOSPITAL_COMMUNITY): Payer: Self-pay | Admitting: Gastroenterology

## 2020-09-02 ENCOUNTER — Ambulatory Visit
Admission: RE | Admit: 2020-09-02 | Discharge: 2020-09-02 | Disposition: A | Discharge status: Home / Self Care | Payer: No Typology Code available for payment source | Source: Ambulatory Visit | Attending: Obstetrics and Gynecology | Admitting: Obstetrics and Gynecology

## 2020-09-02 ENCOUNTER — Other Ambulatory Visit: Payer: Self-pay | Admitting: Obstetrics and Gynecology

## 2020-09-02 ENCOUNTER — Other Ambulatory Visit: Payer: Self-pay

## 2020-09-02 DIAGNOSIS — R2232 Localized swelling, mass and lump, left upper limb: Secondary | ICD-10-CM

## 2020-09-06 ENCOUNTER — Encounter: Payer: Self-pay | Admitting: Internal Medicine

## 2020-09-06 DIAGNOSIS — K2950 Unspecified chronic gastritis without bleeding: Secondary | ICD-10-CM

## 2020-09-06 HISTORY — DX: Unspecified chronic gastritis without bleeding: K29.50

## 2020-09-19 IMAGING — MG MM DIGITAL DIAGNOSTIC UNILAT*L* W/ TOMO W/ CAD
6 series · 6 of 18 positions shown · non-contrast
Comparison: PREVIOUS EXAMS.

CLINICAL DATA: 43-year-old female with tenderness/burning in the
left axillary region.

EXAM:
DIGITAL DIAGNOSTIC UNILATERAL LEFT MAMMOGRAM WITH CAD AND TOMO
ULTRASOUND LEFT AXILLA

[L CC synth-2D]
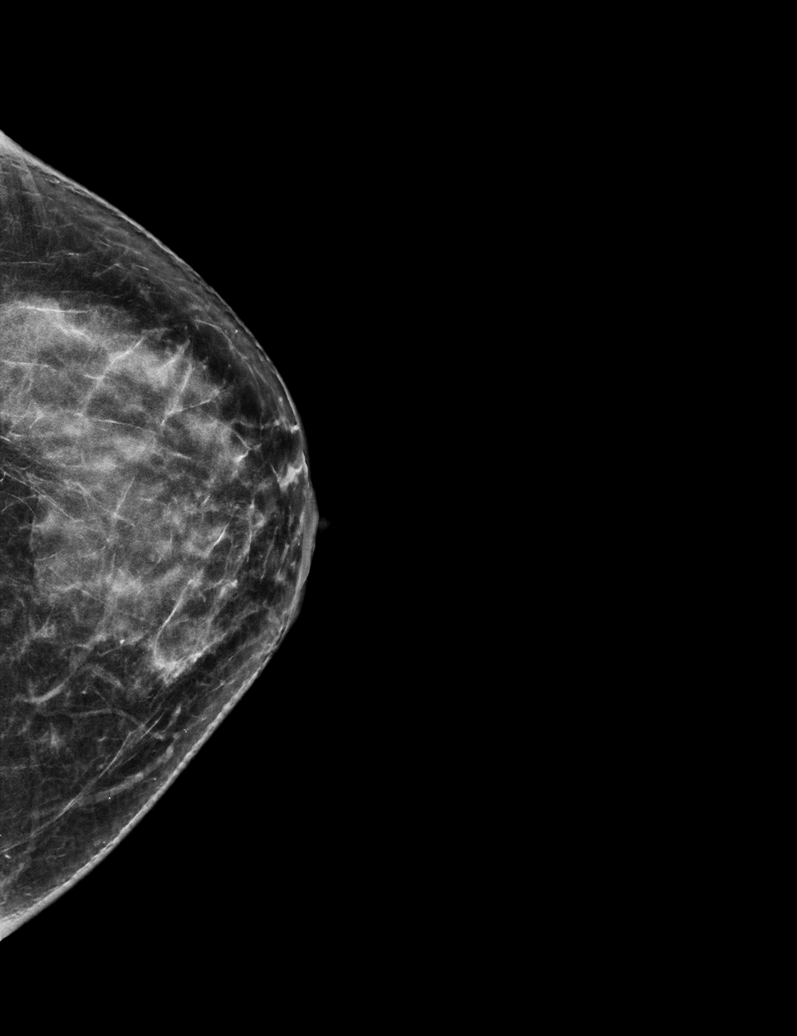

[L TAN synth-2D]
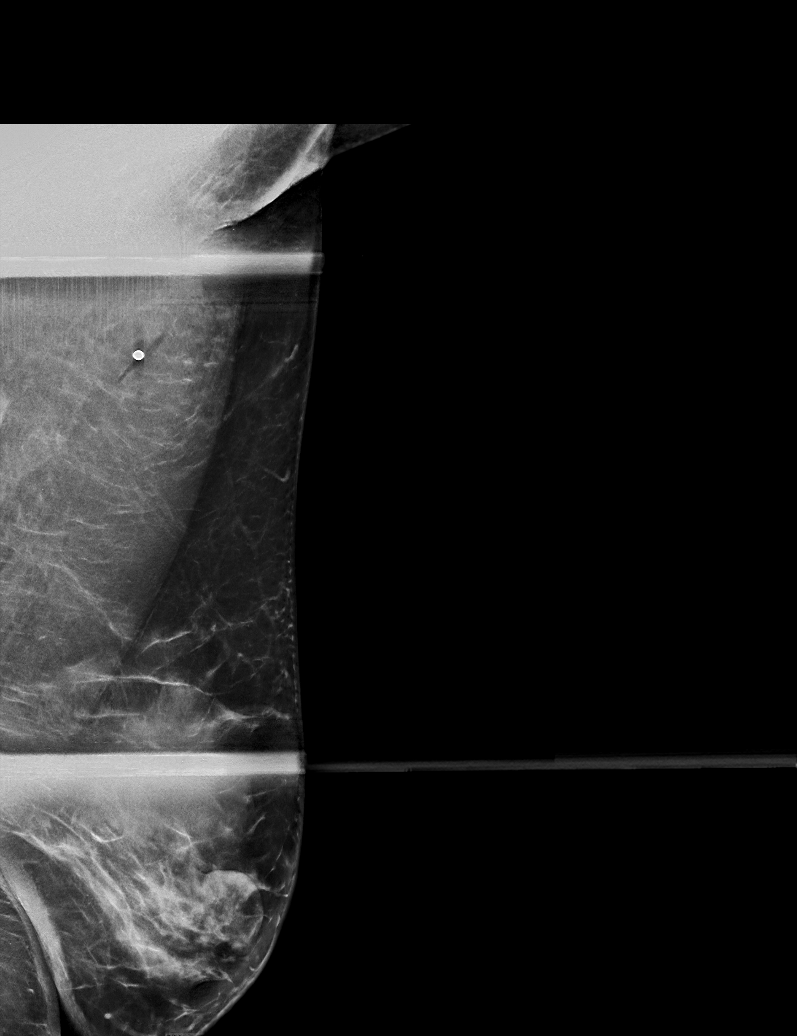

[L MLO synth-2D]
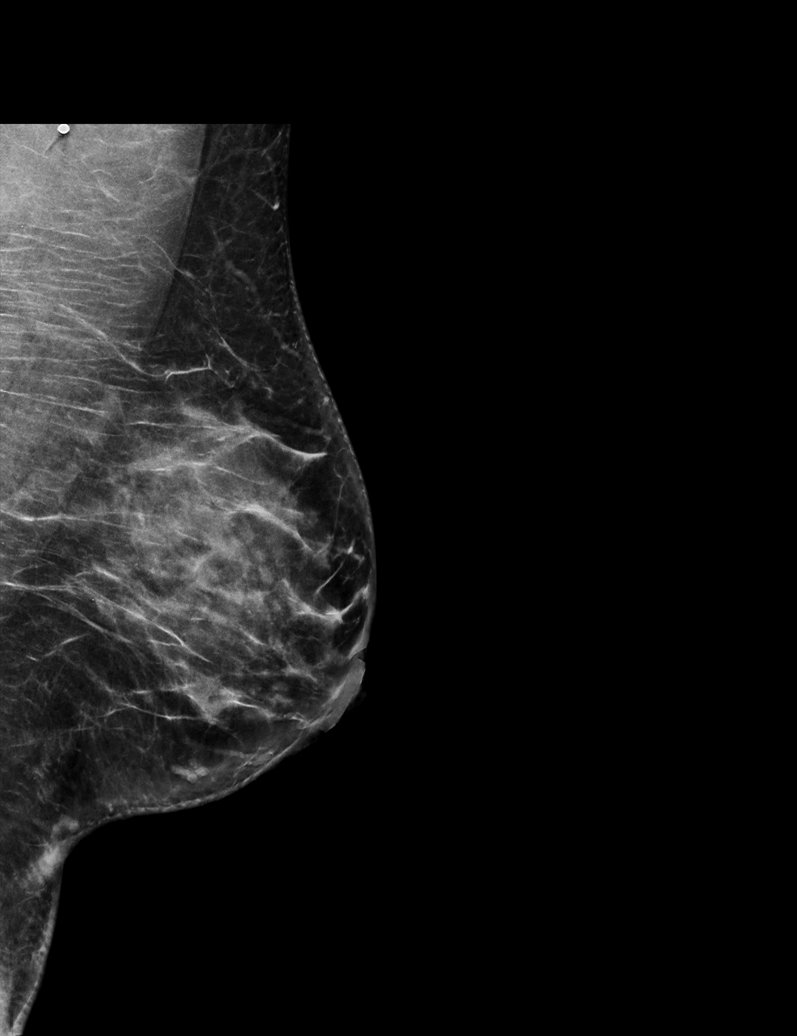

[L CC tomo · tomo slice 29/56.0]
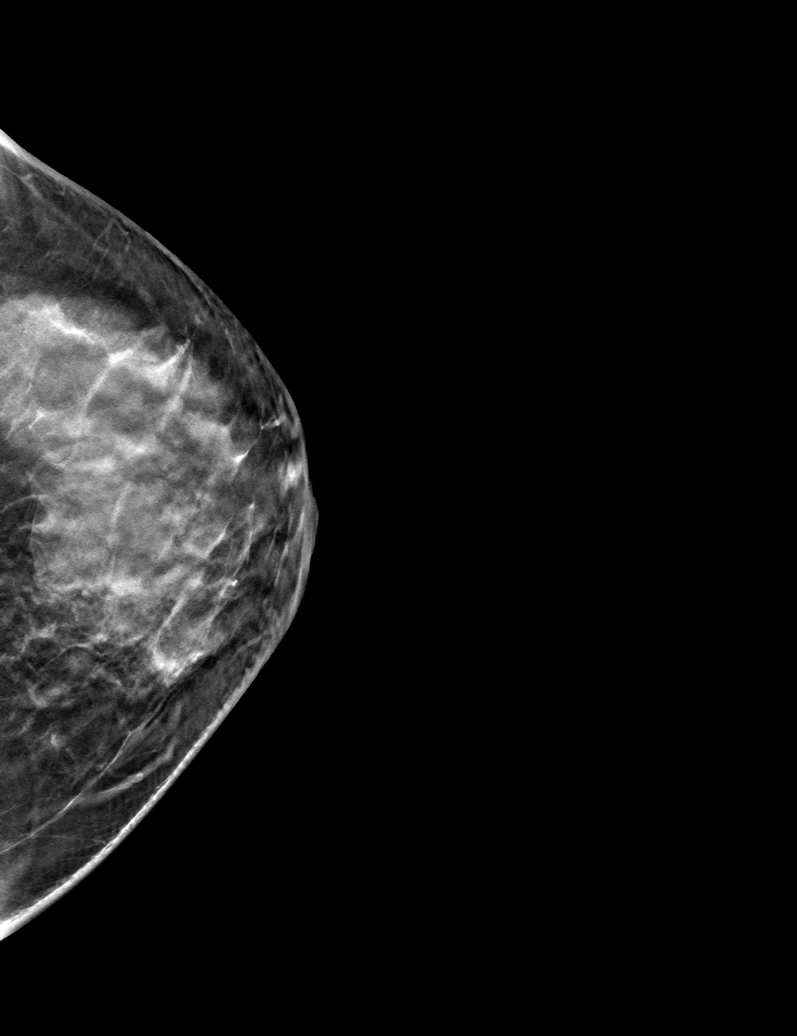

[L TAN tomo · tomo slice 39/77.0]
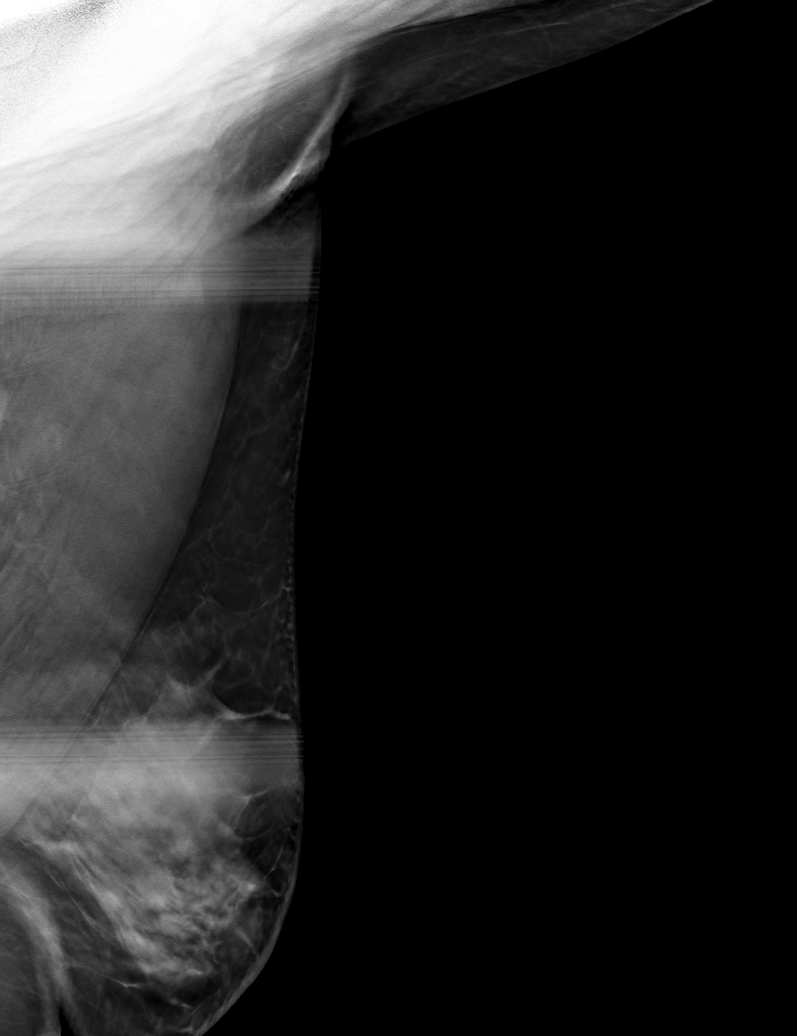

[L MLO tomo · tomo slice 33/66.0]
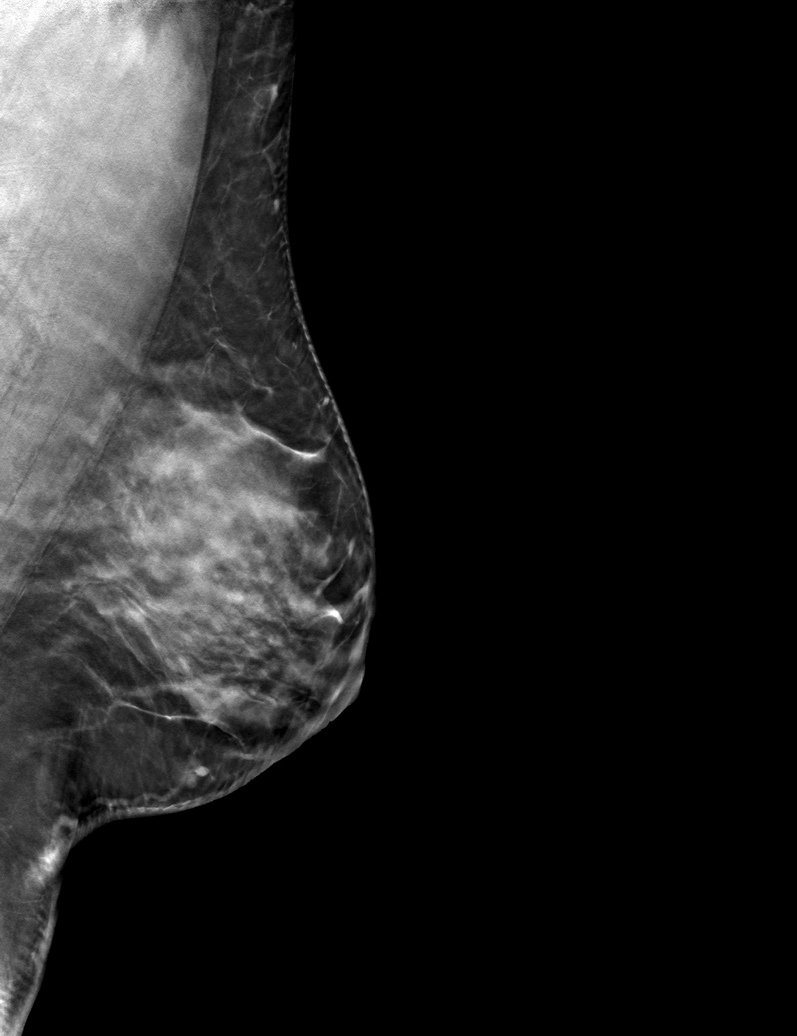

[6 of 18 positions shown; findings below may reference images not displayed]

ACR Breast Density Category c: The breast tissue is heterogeneously
dense, which may obscure small masses.
FINDINGS: No suspicious masses or calcifications are seen in either breast.
Spot compression tangential tomograms were performed over the area
of concern in the left axilla with no definite abnormality seen.

Mammographic images were processed with CAD.

Physical examination of the left axilla does not reveal any palpable
masses. The patient is very mildly tender to palpation of the
axillary tail region.

Targeted ultrasound of the left axilla in region of
tenderness/burning sensation was performed. No suspicious masses or
abnormality seen. No morphologically abnormal lymph nodes
identified.
IMPRESSION: No mammographic or sonographic abnormalities to account for left
axillary tenderness/burning sensation.

RECOMMENDATION:
1. Recommend further management of left axillary tenderness/burning
sensation be based on clinical assessment.

2.  Recommend annual routine screening mammography, due August 2019.

I have discussed the findings and recommendations with the patient.
If applicable, a reminder letter will be sent to the patient
regarding the next appointment.

BI-RADS CATEGORY  1: Negative.

## 2020-09-29 ENCOUNTER — Other Ambulatory Visit: Payer: Self-pay | Admitting: Physician Assistant

## 2020-09-29 ENCOUNTER — Other Ambulatory Visit (HOSPITAL_COMMUNITY): Payer: Self-pay | Admitting: Physician Assistant

## 2020-09-29 DIAGNOSIS — R1011 Right upper quadrant pain: Secondary | ICD-10-CM

## 2020-10-07 ENCOUNTER — Ambulatory Visit (HOSPITAL_COMMUNITY)
Admission: RE | Admit: 2020-10-07 | Discharge: 2020-10-07 | Disposition: A | Discharge status: Home / Self Care | Payer: Self-pay | Source: Ambulatory Visit | Attending: Physician Assistant | Admitting: Physician Assistant

## 2020-10-07 ENCOUNTER — Other Ambulatory Visit: Payer: Self-pay

## 2020-10-07 DIAGNOSIS — R1011 Right upper quadrant pain: Secondary | ICD-10-CM | POA: Insufficient documentation

## 2020-12-26 DIAGNOSIS — F439 Reaction to severe stress, unspecified: Secondary | ICD-10-CM | POA: Insufficient documentation

## 2021-01-11 ENCOUNTER — Other Ambulatory Visit (HOSPITAL_COMMUNITY): Payer: Self-pay | Admitting: Physician Assistant

## 2021-01-11 ENCOUNTER — Other Ambulatory Visit: Payer: Self-pay | Admitting: Physician Assistant

## 2021-01-11 DIAGNOSIS — K2100 Gastro-esophageal reflux disease with esophagitis, without bleeding: Secondary | ICD-10-CM

## 2021-01-20 ENCOUNTER — Ambulatory Visit (HOSPITAL_COMMUNITY)
Admission: RE | Admit: 2021-01-20 | Discharge: 2021-01-20 | Disposition: A | Discharge status: Home / Self Care | Payer: Self-pay | Source: Ambulatory Visit | Attending: Physician Assistant | Admitting: Physician Assistant

## 2021-01-20 ENCOUNTER — Other Ambulatory Visit: Payer: Self-pay

## 2021-01-20 DIAGNOSIS — K2100 Gastro-esophageal reflux disease with esophagitis, without bleeding: Secondary | ICD-10-CM | POA: Insufficient documentation

## 2021-01-20 MED ORDER — TECHNETIUM TC 99M MEBROFENIN IV KIT
5.1000 | PACK | Freq: Once | INTRAVENOUS | Status: AC
  Administered 2021-01-20: 5.1 via INTRAVENOUS

## 2021-01-24 ENCOUNTER — Encounter: Payer: Self-pay | Admitting: Internal Medicine

## 2021-01-24 ENCOUNTER — Ambulatory Visit: Payer: Self-pay | Admitting: Internal Medicine

## 2021-01-24 ENCOUNTER — Telehealth: Payer: Self-pay

## 2021-01-24 ENCOUNTER — Other Ambulatory Visit: Payer: Self-pay

## 2021-01-24 VITALS — BP 110/80 | HR 60 | Resp 16 | Ht 58.5 in | Wt 132.0 lb

## 2021-01-24 DIAGNOSIS — R208 Other disturbances of skin sensation: Secondary | ICD-10-CM

## 2021-01-24 DIAGNOSIS — Z9189 Other specified personal risk factors, not elsewhere classified: Secondary | ICD-10-CM

## 2021-01-24 DIAGNOSIS — R438 Other disturbances of smell and taste: Secondary | ICD-10-CM

## 2021-01-24 NOTE — Telephone Encounter (Signed)
Pt called to report bitter taste in mouth and white on her tongue. Has had issue for about a month and has gotten progressively worse. Pt first time having this issue. Is not taking medication for this issue. Would like appt or recommendations

## 2021-01-24 NOTE — Patient Instructions (Signed)
No vitamins for 2 weeks If bitter taste is gone afterward, may restart Vitamin B tab. If no problem, restart Magnesium If no problem after 2 weeks, restart probiotics If no problem after 2 weeks, restart Multivitamin If at any time the bad taste starts up again, stop whatever you most recently restarted.

## 2021-01-24 NOTE — Telephone Encounter (Signed)
Pt had a visit 01/24/21

## 2021-01-24 NOTE — Progress Notes (Signed)
    Subjective:    Patient ID: Denise Ruiz, female   DOB: [birth year], 45 y.o.   MRN: [id number]   HPI  Denise Ruiz interprets   Tongue and mouth burns all the time--feels like what your mouth would feel like after drinking something temperature hot.  Also with constant bitter taste in mouth.  Bad taste in mouth worse in the morning.  Later describes an iron taste in mouth.  Takes 3 different vitamins daily--B complex, MVI, and magnesium.   She decreased her Omeprazole to once daily, she believes a little over one month ago after EGD in June did not find anything.  She was to taper down after one month to once daily and then every other day.  Her symptoms in her mouth started a little after she tapered to once daily.   She did undergo a HIDA scan a couple of weeks ago with decreased function of gallbladder.  She is waiting to hear back from GI, Dr. Starr Lake, PA-C.    2.  Dental referral:  never heard from dental clinic after referral in April.  Need regular dental care.  Also with a history of dental abscess--tooth that had a root canal removed.    Current Meds  Medication Sig   b complex vitamins capsule Take 1 capsule by mouth daily.   cetirizine (ZYRTEC ALLERGY) 10 MG tablet Take 1 tablet (10 mg total) by mouth daily.   Magnesium (MAGNACAPS) 100 MG CAPS Take 100 mg by mouth 2 (two) times daily.   Multiple Vitamin (MULTIVITAMIN ADULT) TABS Take by mouth.   omeprazole (PRILOSEC) 40 MG capsule 1 cap by mouth twice daily on empty stomach (Patient taking differently: Take 40 mg by mouth daily.)   Probiotic Product (PROBIOTIC PO) Take 1 capsule by mouth daily.   Allergies  Allergen Reactions   Kiwi Extract     Throat swells--does not happen all the time   Soy Allergy Other (See Comments)    Throat swells     Review of Systems    Objective:   BP 110/80 (BP Location: Left Arm, Patient Position: Sitting, Cuff Size: Normal)   Pulse 60   Resp 16   Ht 4' 10.5"  (1.486 m)   Wt 132 lb (59.9 kg)   LMP 12/22/2020   BMI 27.12 kg/m   Physical Exam NAD HEENT:  PERRL, EOMI, right lateral tongue with mild erythema and smoother surface in area where has space from removed right lower molar.  No oral lesion.   Neck:  Supple, No adenopathy, no thyromegaly Chest:  CTA CV:  RRR without murmur or rub.   Abd:  S, NT, No HSM or mass, + BS   Assessment & Plan    Bitter taste/metallic taste with burning in mouth:  hold vitamins for 2 weeks.  If taste/symptoms do not resolve, will try Chlorhexadine swish and gargle.  Not clear if could be a result of possible biliary issues.  Await GI input.    2.  Dental care:  dental referral.  3.  Await plans with GI and decreased function of gallbadder on HIDa scan.

## 2021-04-04 ENCOUNTER — Other Ambulatory Visit: Payer: Self-pay

## 2021-04-04 ENCOUNTER — Other Ambulatory Visit: Payer: Self-pay | Admitting: Internal Medicine

## 2021-04-04 DIAGNOSIS — E782 Mixed hyperlipidemia: Secondary | ICD-10-CM

## 2021-04-04 DIAGNOSIS — R739 Hyperglycemia, unspecified: Secondary | ICD-10-CM

## 2021-04-04 DIAGNOSIS — Z0000 Encounter for general adult medical examination without abnormal findings: Secondary | ICD-10-CM

## 2021-04-05 LAB — CBC WITH DIFFERENTIAL/PLATELET
Basophils Absolute: 0 10*3/uL (ref 0.0–0.2)
Basos: 0 %
EOS (ABSOLUTE): 0.1 10*3/uL (ref 0.0–0.4)
Eos: 2 %
Hematocrit: 41.9 % (ref 34.0–46.6)
Hemoglobin: 14.2 g/dL (ref 11.1–15.9)
Immature Grans (Abs): 0 10*3/uL (ref 0.0–0.1)
Immature Granulocytes: 0 %
Lymphocytes Absolute: 2.3 10*3/uL (ref 0.7–3.1)
Lymphs: 38 %
MCH: 30.7 pg (ref 26.6–33.0)
MCHC: 33.9 g/dL (ref 31.5–35.7)
MCV: 91 fL (ref 79–97)
Monocytes Absolute: 0.4 10*3/uL (ref 0.1–0.9)
Monocytes: 6 %
Neutrophils Absolute: 3.2 10*3/uL (ref 1.4–7.0)
Neutrophils: 54 %
Platelets: 249 10*3/uL (ref 150–450)
RBC: 4.62 x10E6/uL (ref 3.77–5.28)
RDW: 12.5 % (ref 11.7–15.4)
WBC: 5.9 10*3/uL (ref 3.4–10.8)

## 2021-04-05 LAB — COMPREHENSIVE METABOLIC PANEL
ALT: 16 IU/L (ref 0–32)
AST: 15 IU/L (ref 0–40)
Albumin/Globulin Ratio: 1.6 (ref 1.2–2.2)
Albumin: 4.5 g/dL (ref 3.8–4.8)
Alkaline Phosphatase: 48 IU/L (ref 44–121)
BUN/Creatinine Ratio: 21 (ref 9–23)
BUN: 15 mg/dL (ref 6–24)
Bilirubin Total: 0.3 mg/dL (ref 0.0–1.2)
CO2: 26 mmol/L (ref 20–29)
Calcium: 9.2 mg/dL (ref 8.7–10.2)
Chloride: 100 mmol/L (ref 96–106)
Creatinine, Ser: 0.71 mg/dL (ref 0.57–1.00)
Globulin, Total: 2.8 g/dL (ref 1.5–4.5)
Glucose: 102 mg/dL — ABNORMAL HIGH (ref 70–99)
Potassium: 4.7 mmol/L (ref 3.5–5.2)
Sodium: 140 mmol/L (ref 134–144)
Total Protein: 7.3 g/dL (ref 6.0–8.5)
eGFR: 107 mL/min/{1.73_m2} (ref >59)

## 2021-04-05 LAB — HEMOGLOBIN A1C
Est. average glucose Bld gHb Est-mCnc: 114 mg/dL
Hgb A1c MFr Bld: 5.6 % (ref 4.8–5.6)

## 2021-04-05 LAB — LIPID PANEL W/O CHOL/HDL RATIO
Cholesterol, Total: 210 mg/dL — ABNORMAL HIGH (ref 100–199)
HDL: 38 mg/dL — ABNORMAL LOW (ref >39)
LDL Chol Calc (NIH): 144 mg/dL — ABNORMAL HIGH (ref 0–99)
Triglycerides: 156 mg/dL — ABNORMAL HIGH (ref 0–149)
VLDL Cholesterol Cal: 28 mg/dL (ref 5–40)

## 2021-04-08 ENCOUNTER — Encounter: Payer: Self-pay | Admitting: Internal Medicine

## 2021-04-08 ENCOUNTER — Other Ambulatory Visit: Payer: Self-pay

## 2021-04-08 ENCOUNTER — Ambulatory Visit: Payer: Self-pay | Admitting: Internal Medicine

## 2021-04-08 VITALS — BP 114/66 | HR 64 | Resp 12 | Ht 58.75 in | Wt 132.0 lb

## 2021-04-08 DIAGNOSIS — E782 Mixed hyperlipidemia: Secondary | ICD-10-CM

## 2021-04-08 DIAGNOSIS — Z1231 Encounter for screening mammogram for malignant neoplasm of breast: Secondary | ICD-10-CM

## 2021-04-08 DIAGNOSIS — Z0000 Encounter for general adult medical examination without abnormal findings: Secondary | ICD-10-CM

## 2021-04-08 DIAGNOSIS — G5620 Lesion of ulnar nerve, unspecified upper limb: Secondary | ICD-10-CM

## 2021-04-08 DIAGNOSIS — R739 Hyperglycemia, unspecified: Secondary | ICD-10-CM

## 2021-04-08 DIAGNOSIS — E663 Overweight: Secondary | ICD-10-CM

## 2021-04-08 DIAGNOSIS — I83813 Varicose veins of bilateral lower extremities with pain: Secondary | ICD-10-CM

## 2021-04-08 DIAGNOSIS — H547 Unspecified visual loss: Secondary | ICD-10-CM | POA: Insufficient documentation

## 2021-04-08 DIAGNOSIS — K0889 Other specified disorders of teeth and supporting structures: Secondary | ICD-10-CM

## 2021-04-08 NOTE — Patient Instructions (Signed)

## 2021-04-08 NOTE — Progress Notes (Unsigned)
Subjective:    Patient ID: Denise Ruiz, female   DOB: 1975-05-03, 46 y.o.   MRN: [id number]   HPI  Duayne Cal and then Arlana Lindau interprets.  CPE without pap  1.  Pap:  Last 03/2020 and normal.    2.  Mammogram:  Self finding of mass in left breast with ultimately normal mammogram and exam 09/02/2020.  She is to follow up with screen in 08/2020.  Paternal first cousins x 2 with history of breast cancer.  One died of disease at age 76 and the other at 71.  3.  Osteoprevention:  2 servings of almond milk and 1 serving cheese daily.  Walks daily for 30 minutes.  Also works out in gym twice weekly.    4.  Guaiac Cards/FIT:  Last performed 03/2020 and negative for blood.    5.  Colonoscopy/EGD:  EGD 08/2020 biopsies of gastric mucosa showing mild chronic gastritis.  Negative for H. Pylori.  Esophagus was normal save for tiny hiatal hernia.  Dr. Ewing Schlein.  Taking Pantoprazole now on an intermittent basis prior to heavy meals and seems to control symptoms.    6.  Immunizations:  Did not obtain influenza vaccine this year.  Has not had any COVID boosters since original 2 vaccines.    Immunization History  Administered Date(s) Administered   Influenza Inj Mdck Quad With Preservative 01/27/2020   Influenza,inj,Quad PF,6+ Mos 12/11/2014, 01/09/2019   Influenza-Unspecified 01/09/2019   Moderna Sars-Covid-2 Vaccination 08/04/2019, 09/01/2019   Tdap 12/11/2014     7.  Glucose/Cholesterol:  04/04/2021 A1C up to 5.6% with mildly elevated fasting glucose as well.  Cholesterol is significantly up with low HDL, high LDL and triglycerides. Lipid Panel     Component Value Date/Time   CHOL 210 (H) 04/04/2021 0849   TRIG 156 (H) 04/04/2021 0849   HDL 38 (L) 04/04/2021 0849   CHOLHDL 5.9 05/03/2018 0941   VLDL 21 05/03/2018 0941   LDLCALC 144 (H) 04/04/2021 0849   LABVLDL 28 04/04/2021 0849     Current Meds  Medication Sig   b complex vitamins capsule Take 1 capsule by mouth daily.    cetirizine (ZYRTEC ALLERGY) 10 MG tablet Take 1 tablet (10 mg total) by mouth daily.   Digestive Enzyme CAPS Take 1 capsule by mouth daily. With a meal   fluticasone (FLONASE) 50 MCG/ACT nasal spray Place 2 sprays into both nostrils daily.   pantoprazole (PROTONIX) 40 MG tablet Take 40 mg by mouth daily.   Probiotic Product (PROBIOTIC PO) Take 1 capsule by mouth daily.   Allergies  Allergen Reactions   Kiwi Extract     Throat swells--does not happen all the time   Soy Allergy Other (See Comments)    Throat swells     Review of Systems  HENT:         Feels intermittent inflammation in anterior cervical area around lymph nodes as well as posterior neck.  Ultimately, feels like pressure.  Eyes:  Positive for visual disturbance (Decreased acuity).  Respiratory:  Negative for shortness of breath.   Cardiovascular:  Negative for chest pain, palpitations and leg swelling.  Gastrointestinal:        Occasional reflux symptoms  Neurological:  Positive for numbness (Bilateral hands numb when sleeping.  Points to her ring and middle finger as most affected.  Cannot say if thumb or pinky involved as well.  Getting better after last 15 days.  No hx of repetitive movement of hands prior.).  Objective:   BP 114/66 (BP Location: Left Arm, Patient Position: Sitting, Cuff Size: Normal)    Pulse 64    Resp 12    Ht 4' 10.75" (1.492 m)    Wt 132 lb (59.9 kg)    LMP 04/04/2021 (Within Days)    BMI 26.89 kg/m   Physical Exam Anterior and posterior neck musculatrure tight and tender Varicosities of ankle and proximal feet.  Mild tinels at bilateral medial epicondyle  Assessment & Plan    CPE without pap

## 2021-04-12 ENCOUNTER — Telehealth: Payer: Self-pay

## 2021-04-12 ENCOUNTER — Other Ambulatory Visit: Payer: Self-pay

## 2021-04-12 VITALS — BP 104/64

## 2021-04-12 DIAGNOSIS — Z0130 Encounter for examination of blood pressure without abnormal findings: Secondary | ICD-10-CM

## 2021-04-12 DIAGNOSIS — Z1211 Encounter for screening for malignant neoplasm of colon: Secondary | ICD-10-CM

## 2021-04-12 LAB — POC FIT TEST STOOL: Fecal Occult Blood: NEGATIVE

## 2021-04-12 NOTE — Telephone Encounter (Signed)
Headache, neck and upper back pain since 04/10/20. Same pain described in previous visit. Currently taking ibuprofen which helps some, but pain returns. Pain feels like stabbing/poking pain.

## 2021-04-12 NOTE — Progress Notes (Signed)
Patient asked to check her blood pressure after experiencing headache and upper back pain since 04/10/20.

## 2021-04-12 NOTE — Addendum Note (Signed)
Addended by: Mariah Milling on: 04/12/2021 05:28 PM   Modules accepted: Level of Service

## 2021-04-14 NOTE — Telephone Encounter (Signed)
Patient called to report that she is still having symptoms. Currently experiencing headache (feels like only front of her head), eye pain and sore throat. Describes pain to feel like pressure.

## 2021-04-15 NOTE — Telephone Encounter (Signed)
Appointment

## 2021-04-19 ENCOUNTER — Ambulatory Visit: Payer: Self-pay | Admitting: Internal Medicine

## 2021-04-19 ENCOUNTER — Encounter: Payer: Self-pay | Admitting: Internal Medicine

## 2021-04-19 ENCOUNTER — Other Ambulatory Visit: Payer: Self-pay

## 2021-04-19 VITALS — BP 104/64 | HR 68 | Resp 12 | Ht 58.75 in | Wt 133.5 lb

## 2021-04-19 DIAGNOSIS — J0140 Acute pansinusitis, unspecified: Secondary | ICD-10-CM

## 2021-04-19 DIAGNOSIS — G44209 Tension-type headache, unspecified, not intractable: Secondary | ICD-10-CM

## 2021-04-19 MED ORDER — IBUPROFEN 200 MG PO TABS: ORAL_TABLET | ORAL | Status: AC

## 2021-04-19 MED ORDER — AMOXICILLIN-POT CLAVULANATE 875-125 MG PO TABS: ORAL_TABLET | ORAL | 0 refills | Status: DC

## 2021-04-19 NOTE — Progress Notes (Signed)
Subjective:    Patient ID: Denise Ruiz, female   DOB: [birth year], 46 y.o.   MRN: [id number]   HPI  Duayne Cal interprets  Headaches:  Points to bilateral eyes/glabellar area and nuchal area.  Describes intermittent squeezing pain, but was constant for the first two days and nights.   Started about 6 days ago.   Just awakened with the pain last Thursday. Has taken 400 mg of Ibuprofen, which helps.  Decreases pain by about 60% for about 6 hours.   Feels dizzy with the pain at times, and some nausea, but no vomiting. No aura. No photophobia.   No phonophobia.  No fever No posterior pharyngeal drainage or congestion.   No sneezing any more than usual.   Nose is dry with heat in home.  Nasal saline has helped She doesn't feel this is her allergies worsening.  Usually has a runny nose with that. Does clean homes with pets and dust, but has not noted that any worse than usual. She does wear a mask when cleaning. Has had similar headaches many years ago.   Denies increased stress, but she is getting ready to see her parents--they are coming to visit for first time in 23 years (they have not been together in that period of time. Also, states she has felt really sleepy recently--like could sit and fall asleep.   Is worried she may be deficient in a vitamin.      Current Meds  Medication Sig   b complex vitamins capsule Take 1 capsule by mouth daily.   cetirizine (ZYRTEC ALLERGY) 10 MG tablet Take 1 tablet (10 mg total) by mouth daily.   Digestive Enzyme CAPS Take 1 capsule by mouth daily. With a meal   fluticasone (FLONASE) 50 MCG/ACT nasal spray Place 2 sprays into both nostrils daily.   ibuprofen (ADVIL) 200 MG tablet Take 200 mg by mouth every 6 (six) hours as needed. 2 tabs every 6 hours as needed   pantoprazole (PROTONIX) 40 MG tablet Take 40 mg by mouth as needed.   Probiotic Product (PROBIOTIC PO) Take 1 capsule by mouth daily.   Allergies  Allergen  Reactions   Kiwi Extract     Throat swells--does not happen all the time   Soy Allergy Other (See Comments)    Throat swells     Review of Systems    Objective:   BP 104/64 (BP Location: Right Arm, Patient Position: Sitting, Cuff Size: Normal)   Pulse 68   Resp 12   Ht 4' 10.75" (1.492 m)   Wt 133 lb 8 oz (60.6 kg)   LMP 04/04/2021 (Within Days)   BMI 27.19 kg/m   Physical Exam NAD HEENT:  PERRL, EOMI, Discs sharpTMs pearly gray, Tender over bilateral frontal and maxillary sinus areas.  Posterior pharynx with lymphoid hypertrophy.  Nasal mucosa mildly erythematous and swollen Neck:  Supple, No adenopathy.  Traps and cervical paraspinous musculature mildly tender.  Chest:  CTA CV:  RRR without murmur or rub.  Radial pulses normal and equal. Neuro:  A & O x 3, CN II-XII grossly intact.  Light touch grossly normal throughout.  Motor 5/5, DTRs 2+/4 throughout.  Gait normal    Assessment & Plan   Tension HA:  Encouraged warm packing of neck followed by neck stretches given at bedtime daily.  Good supportive pillow, sleep on side.    2.  Sinusitis:  Augmentin 875/125 twice daily for 10 days course.  Neti pot.  Follow up as needed.

## 2021-04-19 NOTE — Telephone Encounter (Signed)
Made appt for 04/19/2021

## 2021-10-04 ENCOUNTER — Other Ambulatory Visit: Payer: Self-pay

## 2021-10-07 ENCOUNTER — Ambulatory Visit: Payer: Self-pay | Admitting: Internal Medicine

## 2021-10-25 ENCOUNTER — Other Ambulatory Visit: Payer: Self-pay

## 2021-10-25 DIAGNOSIS — E782 Mixed hyperlipidemia: Secondary | ICD-10-CM

## 2021-10-25 DIAGNOSIS — R7309 Other abnormal glucose: Secondary | ICD-10-CM

## 2021-10-26 LAB — LIPID PANEL W/O CHOL/HDL RATIO
Cholesterol, Total: 197 mg/dL (ref 100–199)
HDL: 33 mg/dL — ABNORMAL LOW (ref >39)
LDL Chol Calc (NIH): 137 mg/dL — ABNORMAL HIGH (ref 0–99)
Triglycerides: 148 mg/dL (ref 0–149)
VLDL Cholesterol Cal: 27 mg/dL (ref 5–40)

## 2021-10-26 LAB — HEMOGLOBIN A1C
Est. average glucose Bld gHb Est-mCnc: 111 mg/dL
Hgb A1c MFr Bld: 5.5 % (ref 4.8–5.6)

## 2021-11-16 ENCOUNTER — Ambulatory Visit: Payer: Self-pay | Admitting: Internal Medicine

## 2021-11-16 ENCOUNTER — Encounter: Payer: Self-pay | Admitting: Internal Medicine

## 2021-11-16 VITALS — BP 108/66 | HR 76 | Resp 16 | Ht 58.75 in | Wt 140.0 lb

## 2021-11-16 DIAGNOSIS — N939 Abnormal uterine and vaginal bleeding, unspecified: Secondary | ICD-10-CM

## 2021-11-16 DIAGNOSIS — E782 Mixed hyperlipidemia: Secondary | ICD-10-CM

## 2021-11-16 DIAGNOSIS — R739 Hyperglycemia, unspecified: Secondary | ICD-10-CM

## 2021-11-16 NOTE — Progress Notes (Signed)
Subjective:    Patient ID: Denise Ruiz, female   DOB: 1975/09/29, 46 y.o.   MRN: [id number]   HPI  Tildon Husky interprets   Hyperlipidemia:  Generally better.  Has worked on eating in a healthier way.  Cut out bad fat, fried food, red meat.  Eating eggs, veggies, and chicken.  She is wondering how to get her low HDL up better.  Discussed healthy fats.  She does eat a lot of avocados and almonds.   Discussed working up for endurance with walking, running or other similar exercise will help increase HDL.  She does walk almost every day for about 45 minutes. and goes to the gym 2-3 times per week.    2.  Hyperglycemia with history of A1C at upper limits of normal.  Discussed down slightly to 5.5%  3.  Menstrual irregularity:  For past 4 months, has a period for 3 -4 days, then 8 days later, another 2-3 days of flow.  This last month, had a period that lasted a full 15 days.  Also has developed pain in her RLQ.  Has a history of ovarian cysts and states feels very similar.  Started another period today.  Last day of flow previously was July 19 through Aug 1.  No cramping with the blood flow.  Not necessarily heavy flow--does seem to alternate between lighter and heavier flow by month.  She is having hot flashes only at night.  No night sweats.    Current Meds  Medication Sig   b complex vitamins capsule Take 1 capsule by mouth daily.   cetirizine (ZYRTEC ALLERGY) 10 MG tablet Take 1 tablet (10 mg total) by mouth daily.   Cholecalciferol (VITAMIN D) 125 MCG (5000 UT) CAPS Take by mouth daily.   fluticasone (FLONASE) 50 MCG/ACT nasal spray Place 2 sprays into both nostrils daily.   ibuprofen (ADVIL) 200 MG tablet 2 a 4 pastillas con comida cada 6 horas a necesita dolor de cabeza   MAGNESIUM PO Take by mouth daily.   Omega-3 Fatty Acids (OMEGA 3 PO) Take by mouth daily.   pantoprazole (PROTONIX) 40 MG tablet Take 40 mg by mouth as needed.   Probiotic Product (PROBIOTIC PO) Take 1  capsule by mouth daily.   Allergies  Allergen Reactions   Kiwi Extract     Throat swells--does not happen all the time   Soy Allergy Other (See Comments)    Throat swells     Review of Systems    Objective:   BP 108/66 (BP Location: Right Arm, Patient Position: Sitting, Cuff Size: Normal)   Pulse 76   Resp 16   Ht 4' 10.75" (1.492 m)   Wt 140 lb (63.5 kg)   LMP 11/16/2021 (Exact Date)   BMI 28.52 kg/m   Physical Exam NAD Lungs: CTA CV:  RRR without murmur or rub.  Radial pulses normal and equal Abd:  S, NT, No HSM or mass + BS GU:  significant menstrual flow, no clotting.  Difficult to see entire cervix, but long pointed extension of cervix at 12 O'Clock--not a growth, but just odd extension of what appears to be normal cervical tissue.  Unable to see if any other vaginal discharge.  Uterus lies to patient's left pelvis somewhat.  Unable to separate from adnexal area.  No uterine mass or tenderness.   No adnexal mass or tenderness bilaterally.  Assessment & Plan    Hyperlipidemia with low HDL:  has made improvements with  lifestyle change.  CPM  2.  Hyperglycemia with improved A1C in normal range:  improved with lifestyle change--to continue.    3.  Recurrent and sometimes extended periods in relatively regular fashion.  No findings to explain on exam.  Perhaps due to perimenopausal time period.  Send for pelvic ultrasound to be certain no intrauterine abnormality leading to bleed.

## 2021-11-16 NOTE — Patient Instructions (Signed)
Call if bleeding worsens or is prolonged

## 2022-01-12 ENCOUNTER — Other Ambulatory Visit: Payer: Self-pay

## 2022-01-12 DIAGNOSIS — Z1231 Encounter for screening mammogram for malignant neoplasm of breast: Secondary | ICD-10-CM

## 2022-02-21 ENCOUNTER — Ambulatory Visit: Payer: Self-pay | Admitting: *Deleted

## 2022-02-21 ENCOUNTER — Ambulatory Visit: Admission: RE | Admit: 2022-02-21 | Payer: No Typology Code available for payment source | Source: Ambulatory Visit

## 2022-02-21 ENCOUNTER — Other Ambulatory Visit: Payer: Self-pay | Admitting: *Deleted

## 2022-02-21 VITALS — BP 112/64 | Wt 137.0 lb

## 2022-02-21 DIAGNOSIS — Z1211 Encounter for screening for malignant neoplasm of colon: Secondary | ICD-10-CM

## 2022-02-21 DIAGNOSIS — Z1239 Encounter for other screening for malignant neoplasm of breast: Secondary | ICD-10-CM

## 2022-02-21 DIAGNOSIS — N6325 Unspecified lump in the left breast, overlapping quadrants: Secondary | ICD-10-CM

## 2022-02-21 NOTE — Progress Notes (Signed)
[name] is a 46 y.o. female who presents to [facility] clinic today with complaint of left axillary burning that comes and goes since 06/03/2013 that was noted on previous exam 08/12/2020.    Pap Smear: Pap smear not completed today. Last Pap smear was 04/08/2020 at [facility] clinic and was normal. Per patient has no history of an abnormal Pap smear. Last Pap smear result is available in Epic.    Physical exam: Breasts Breasts symmetrical. No skin abnormalities bilateral breasts. No nipple retraction bilateral breasts. No nipple discharge bilateral breasts. No lymphadenopathy. No lumps palpated right breast. Palpated a pea sized lump within the left breast at 12 o'clock 2 cm from the nipple. No complaints of pain or tenderness on exam.  MS DIGITAL DIAG TOMO BILAT  Result Date: 09/02/2020 CLINICAL DATA:  Mass felt by the patient in the left axilla for the past month. EXAM: DIGITAL DIAGNOSTIC BILATERAL MAMMOGRAM WITH TOMOSYNTHESIS AND CAD; [place] AXILLARY LEFT TECHNIQUE: Bilateral digital diagnostic mammography and breast tomosynthesis was performed. The images were evaluated with computer-aided detection.; Targeted ultrasound examination of the left axilla was performed. COMPARISON:  Previous exam(s). ACR Breast Density Category d: The breast tissue is extremely dense, which lowers the sensitivity of mammography. FINDINGS: Stable mammographic appearance of the breasts with no interval findings suspicious for malignancy in either breast. No mammographically visible left axillary abnormality at the location of the mass felt by the patient, marked with a metallic marker. On physical exam, the patient has an obliquely oriented ridge of mildly thickened skin in the left axilla at the location of a skin crease. This corresponds to the mass felt by the patient. There is no actual palpable mass today. Targeted ultrasound is performed, showing mildly diffusely thickened skin corresponding to the thickened  skin on physical examination. The underlying subcutaneous fat and muscles have normal appearances. No enlarged lymph nodes. IMPRESSION: No evidence of malignancy. RECOMMENDATION: Bilateral screening mammogram in 1 year. I have discussed the findings and recommendations with the patient. If applicable, a reminder letter will be sent to the patient regarding the next appointment. BI-RADS CATEGORY  1: Negative. Electronically Signed   By: Beckie Salts M.D.   On: 09/02/2020 13:35  MS DIGITAL DIAG TOMO UNI LEFT  Result Date: 08/05/2019 CLINICAL DATA:  46 year old female with tenderness/burning in the left axillary region. EXAM: DIGITAL DIAGNOSTIC UNILATERAL LEFT MAMMOGRAM WITH CAD AND TOMO ULTRASOUND LEFT AXILLA COMPARISON:  PREVIOUS EXAMS. ACR Breast Density Category c: The breast tissue is heterogeneously dense, which may obscure small masses. FINDINGS: No suspicious masses or calcifications are seen in either breast. Spot compression tangential tomograms were performed over the area of concern in the left axilla with no definite abnormality seen. Mammographic images were processed with CAD. Physical examination of the left axilla does not reveal any palpable masses. The patient is very mildly tender to palpation of the axillary tail region. Targeted ultrasound of the left axilla in region of tenderness/burning sensation was performed. No suspicious masses or abnormality seen. No morphologically abnormal lymph nodes identified. IMPRESSION: No mammographic or sonographic abnormalities to account for left axillary tenderness/burning sensation. RECOMMENDATION: 1. Recommend further management of left axillary tenderness/burning sensation be based on clinical assessment. 2.  Recommend annual routine screening mammography, due June 2021. I have discussed the findings and recommendations with the patient. If applicable, a reminder letter will be sent to the patient regarding the next appointment. BI-RADS CATEGORY  1:  Negative. Electronically Signed   By: Edwin Cap  M.D.   On: 08/05/2019 12:04   MS DIGITAL SCREENING TOMO BILATERAL  Result Date: 09/09/2018 CLINICAL DATA:  Screening. EXAM: DIGITAL SCREENING BILATERAL MAMMOGRAM WITH TOMO AND CAD COMPARISON:  Previous exam(s). ACR Breast Density Category d: The breast tissue is extremely dense, which lowers the sensitivity of mammography FINDINGS: There are no findings suspicious for malignancy. Images were processed with CAD. IMPRESSION: No mammographic evidence of malignancy. A result letter of this screening mammogram will be mailed directly to the patient. RECOMMENDATION: Screening mammogram in one year. (Code:SM-B-01Y) BI-RADS CATEGORY  1: Negative. Electronically Signed   By: Beckie Salts M.D.   On: 09/09/2018 07:50   MM DIAG BREAST TOMO UNI RIGHT  Result Date: 01/03/2018 CLINICAL DATA:  Palpable lump in the RIGHT axilla for 2 weeks. EXAM: DIGITAL DIAGNOSTIC RIGHT MAMMOGRAM WITH CAD AND TOMO ULTRASOUND RIGHT BREAST COMPARISON:  Previous exams including bilateral diagnostic mammogram dated 03/08/2017. ACR Breast Density Category c: The breast tissue is heterogeneously dense, which may obscure small masses. FINDINGS: There are no new dominant masses, suspicious calcifications or secondary signs of malignancy within the RIGHT breast. There is a partially obscured mass within the RIGHT axilla, at superficial depth (possibly within the skin), corresponding to the area of clinical concern, with overlying skin markers in place. Mildly prominent lymph node noted within the deeper aspects of the RIGHT axilla, likely corresponding as an incidental finding. Mammographic images were processed with CAD. Targeted ultrasound is performed, showing a single contiguous complicated sebaceous cyst within the skin of the RIGHT axilla, measuring 3.3 cm long axis and 0.9 cm thickness, corresponding to the sites of palpable lumps. No extension of the collection into the underlying soft  tissues. No solid or cystic mass. No enlarged or morphologically abnormal lymph nodes within the RIGHT axilla. IMPRESSION: 1. Complicated sebaceous cyst within the skin of the RIGHT axilla, with internal debris, measuring 3.3 cm long axis and 0.9 cm thickness, corresponding to the area of clinical concern in the RIGHT axilla. 2. No evidence of malignancy within the RIGHT breast or RIGHT axilla. RECOMMENDATION: 1. Clinical follow-up. Patient was informed that sebaceous cysts are benign and typically self-limiting. Patient was instructed to use warm compresses to expedite resolution. Patient was encouraged to follow-up with referring physician if the palpable findings increased in size/extent or if any surrounding skin redness or warmth developed. If associated clinical findings increase/worsen, would consider surgical consultation for possible excision. 2. Annual screening mammograms. Next LEFT breast screening mammogram will be due in December of 2019. I have discussed the findings and recommendations with the patient. Results were also provided in writing at the conclusion of the visit. If applicable, a reminder letter will be sent to the patient regarding the next appointment. BI-RADS CATEGORY  2: Benign. Electronically Signed   By: Bary Richard M.D.   On: 01/03/2018 15:21   MM DIAG BREAST TOMO BILATERAL  Addendum Date: 03/08/2017   ADDENDUM REPORT: 03/08/2017 14:01 ADDENDUM: Recommendation: Screening mammogram in one year.(Code:SM-B-01Y) Electronically Signed   By: Annia Belt M.D.   On: 03/08/2017 14:01   Result Date: 03/08/2017 CLINICAL DATA:  Patient presents for palpable abnormality within the left axilla. EXAM: 2D DIGITAL DIAGNOSTIC BILATERAL MAMMOGRAM WITH CAD AND ADJUNCT TOMO ULTRASOUND LEFT BREAST COMPARISON:  Previous exam(s). ACR Breast Density Category c: The breast tissue is heterogeneously dense, which may obscure small masses. FINDINGS: No concerning masses, calcifications or distortion  identified within either breast. No discrete abnormality identified within the left axilla at the site of palpable  abnormality. Mammographic images were processed with CAD. On physical exam, I palpate no discrete mass within the left axilla. Targeted ultrasound is performed, showing normal axillary contents without suspicious mass within the left axilla. IMPRESSION: No mammographic evidence for malignancy. RECOMMENDATION: Continued clinical evaluation for reported left axillary palpable abnormality. No mammographic evidence for malignancy. I have discussed the findings and recommendations with the patient. Results were also provided in writing at the conclusion of the visit. If applicable, a reminder letter will be sent to the patient regarding the next appointment. BI-RADS CATEGORY  1: Negative. Electronically Signed: By: Annia Belt M.D. On: 03/08/2017 09:53     Pelvic/Bimanual Pap is not indicated today per BCCCP guidelines.   Smoking History: Patient has never smoked.    Patient Navigation: Patient education provided. Access to services provided for patient through [employer] program. Used Spanish interpreter Natale Lay from [facility] provided.   Colorectal Cancer Screening: Per patient has never had colonoscopy completed. FIT Test given to patient to complete. No complaints today.    Breast and Cervical Cancer Risk Assessment: Patient has family history of three paternal first cousins having breast cancer. Patient has no known genetic mutations or history of radiation treatment to the chest before age 18. Patient does not have history of cervical dysplasia, immunocompromised, or DES exposure in-utero.   Risk Scores as of 02/21/2022     Dondra Spry           5-year 0.61 %   Lifetime 6.91 %   This patient is [place] but has no documented birth country, so the [place] model used data from [place] patients to calculate their risk score. Document a birth country in the Demographics activity for a more  accurate score.         Last calculated by Caprice Red, CMA on 02/21/2022 at 12:49 PM        A: BCCCP exam without pap smear Complaint of left axillary pain.  P: Referred patient to the [facility] for a diagnostic mammogram. Appointment scheduled Thursday, March 09, 2022 at 1310.   Priscille Heidelberg, RN 02/21/2022 12:57 PM

## 2022-02-21 NOTE — Patient Instructions (Signed)
Explained breast self awareness with [name]. Patient did not need a Pap smear today due to last Pap smear was 04/08/2020. Let her know BCCCP will cover Pap smears every 3 years unless has a history of abnormal Pap smears. Referred patient to the [facility] for a diagnostic mammogram. Appointment scheduled Thursday, March 09, 2022 at 1310. Patient aware of appointment and will be there. Denise Ruiz verbalized understanding.  Gaelan Glennon, Kathaleen Maser, RN 12:57 PM

## 2022-02-23 ENCOUNTER — Ambulatory Visit: Payer: Self-pay | Admitting: Internal Medicine

## 2022-02-23 ENCOUNTER — Encounter: Payer: Self-pay | Admitting: Internal Medicine

## 2022-02-23 VITALS — BP 120/80 | HR 64 | Resp 16 | Ht 58.75 in | Wt 136.0 lb

## 2022-02-23 DIAGNOSIS — N644 Mastodynia: Secondary | ICD-10-CM

## 2022-02-23 DIAGNOSIS — M546 Pain in thoracic spine: Secondary | ICD-10-CM

## 2022-02-23 DIAGNOSIS — G8929 Other chronic pain: Secondary | ICD-10-CM | POA: Insufficient documentation

## 2022-02-23 DIAGNOSIS — M79622 Pain in left upper arm: Secondary | ICD-10-CM

## 2022-02-23 MED ORDER — DICLOFENAC SODIUM 1 % EX GEL: CUTANEOUS | Status: AC

## 2022-02-23 NOTE — Progress Notes (Signed)
    Subjective:    Patient ID: Denise Ruiz, female   DOB: [birth year], 46 y.o.   MRN: [id number]   HPI  Tildon Husky interprets  Left breast pain with history of mass:  not clear why she did not set up mammogram in June when received her letter to repeat Mammogram.  States the burning discomfort is actually in her left axilla and when she turns, she gets pain in her thoracic back with a pop at about her bra clasp level.  She cannot seem to identify an actual area of her breast that is tender or painful.   Because the pain is not resolving, she is worried.  She states she has the pain generally on a daily basis.   Has not taken anything, including ibuprofen for the pain. Has been seen at [facility] already this month for the same issue and is set up to get a mammogram early next month (December)  Current Meds  Medication Sig   b complex vitamins capsule Take 1 capsule by mouth daily.   cetirizine (ZYRTEC ALLERGY) 10 MG tablet Take 1 tablet (10 mg total) by mouth daily.   Cholecalciferol (VITAMIN D) 125 MCG (5000 UT) CAPS Take by mouth daily.   fluticasone (FLONASE) 50 MCG/ACT nasal spray Place 2 sprays into both nostrils daily.   ibuprofen (ADVIL) 200 MG tablet 2 a 4 pastillas con comida cada 6 horas a necesita dolor de cabeza   MAGNESIUM PO Take by mouth daily.   Omega-3 Fatty Acids (OMEGA 3 PO) Take by mouth daily.   pantoprazole (PROTONIX) 40 MG tablet Take 40 mg by mouth as needed.   Probiotic Product (PROBIOTIC PO) Take 1 capsule by mouth daily.   Allergies  Allergen Reactions   Kiwi Extract     Throat swells--does not happen all the time   Soy Allergy Other (See Comments)    Throat swells     Review of Systems    Objective:   BP 120/80 (BP Location: Right Arm, Patient Position: Sitting, Cuff Size: Normal)   Pulse 64   Resp 16   Ht 4' 10.75" (1.492 m)   Wt 136 lb (61.7 kg)   LMP 02/16/2022 (Exact Date)   BMI 27.70 kg/m   Physical Exam NAD Neck:  Supple, No  adenopathy Chest:  CTA CV:  RRR without murmur or rub.  Radial pulses normal and equal Breasts without focal mass, skin dimpling, nipple discharge or axillary adenopathy.  She does have tenderness with palpation of musculature of left posterior axilla and around to bra clasp level area on left thoracic back.  No palpable muscle spasm currently.   Neuro:  UE:  no loss of light touch sensation.  Motor 5/5 and DTRs 2+/4 throughout.   Assessment & Plan    Muscular Axillary and posterior thoracic back pain:  Diclofenac gel up to 4 times daily topically as needed.  Referral to [facility]  2.  HM:  refused influenza and COViD boosters today.

## 2022-02-26 LAB — FECAL OCCULT BLOOD, IMMUNOCHEMICAL: Fecal Occult Bld: NEGATIVE

## 2022-03-01 ENCOUNTER — Telehealth: Payer: Self-pay

## 2022-03-01 NOTE — Telephone Encounter (Signed)
Telephone call made to the pt with Interpreter, Natale Lay. Pt has been advised of her negative FIT test and understands she will need to repeat it once a year. Pt also understands she has a follow-up appt at the [facility] on 03/09/22 at 1:10pm and plans to present for the exam. Pt also understands if she no shows the appt without cancelling it in advance, she will be subject to a $75 fee. All questions were answered and no further assistance was needed at this time.

## 2022-03-09 ENCOUNTER — Ambulatory Visit
Admission: RE | Admit: 2022-03-09 | Discharge: 2022-03-09 | Disposition: A | Discharge status: Home / Self Care | Payer: No Typology Code available for payment source | Source: Ambulatory Visit | Attending: Obstetrics and Gynecology | Admitting: Obstetrics and Gynecology

## 2022-03-09 DIAGNOSIS — N6325 Unspecified lump in the left breast, overlapping quadrants: Secondary | ICD-10-CM

## 2022-04-13 ENCOUNTER — Other Ambulatory Visit: Payer: Self-pay

## 2022-04-18 ENCOUNTER — Other Ambulatory Visit: Payer: Self-pay

## 2022-04-18 DIAGNOSIS — G8929 Other chronic pain: Secondary | ICD-10-CM

## 2022-04-18 DIAGNOSIS — E782 Mixed hyperlipidemia: Secondary | ICD-10-CM

## 2022-04-18 DIAGNOSIS — M546 Pain in thoracic spine: Secondary | ICD-10-CM

## 2022-04-19 ENCOUNTER — Encounter: Payer: Self-pay | Admitting: Internal Medicine

## 2022-04-19 ENCOUNTER — Ambulatory Visit: Payer: Self-pay | Admitting: Internal Medicine

## 2022-04-19 VITALS — BP 104/64 | HR 72 | Resp 16 | Ht 58.75 in | Wt 136.0 lb

## 2022-04-19 DIAGNOSIS — Z1321 Encounter for screening for nutritional disorder: Secondary | ICD-10-CM

## 2022-04-19 DIAGNOSIS — Z0000 Encounter for general adult medical examination without abnormal findings: Secondary | ICD-10-CM

## 2022-04-19 DIAGNOSIS — H547 Unspecified visual loss: Secondary | ICD-10-CM

## 2022-04-19 LAB — LIPID PANEL W/O CHOL/HDL RATIO
Cholesterol, Total: 197 mg/dL (ref 100–199)
HDL: 38 mg/dL — ABNORMAL LOW (ref >39)
LDL Chol Calc (NIH): 135 mg/dL — ABNORMAL HIGH (ref 0–99)
Triglycerides: 134 mg/dL (ref 0–149)
VLDL Cholesterol Cal: 24 mg/dL (ref 5–40)

## 2022-04-19 LAB — COMPREHENSIVE METABOLIC PANEL
ALT: 14 IU/L (ref 0–32)
AST: 16 IU/L (ref 0–40)
Albumin/Globulin Ratio: 2 (ref 1.2–2.2)
Albumin: 4.5 g/dL (ref 3.9–4.9)
Alkaline Phosphatase: 48 IU/L (ref 44–121)
BUN/Creatinine Ratio: 17 (ref 9–23)
BUN: 12 mg/dL (ref 6–24)
Bilirubin Total: 0.3 mg/dL (ref 0.0–1.2)
CO2: 25 mmol/L (ref 20–29)
Calcium: 9.1 mg/dL (ref 8.7–10.2)
Chloride: 99 mmol/L (ref 96–106)
Creatinine, Ser: 0.71 mg/dL (ref 0.57–1.00)
Globulin, Total: 2.3 g/dL (ref 1.5–4.5)
Glucose: 96 mg/dL (ref 70–99)
Potassium: 4.1 mmol/L (ref 3.5–5.2)
Sodium: 137 mmol/L (ref 134–144)
Total Protein: 6.8 g/dL (ref 6.0–8.5)
eGFR: 106 mL/min/{1.73_m2} (ref >59)

## 2022-04-19 LAB — CBC WITH DIFFERENTIAL/PLATELET
Basophils Absolute: 0 10*3/uL (ref 0.0–0.2)
Basos: 0 %
EOS (ABSOLUTE): 0.1 10*3/uL (ref 0.0–0.4)
Eos: 2 %
Hematocrit: 40.5 % (ref 34.0–46.6)
Hemoglobin: 13.7 g/dL (ref 11.1–15.9)
Immature Grans (Abs): 0 10*3/uL (ref 0.0–0.1)
Immature Granulocytes: 0 %
Lymphocytes Absolute: 2.1 10*3/uL (ref 0.7–3.1)
Lymphs: 31 %
MCH: 30.4 pg (ref 26.6–33.0)
MCHC: 33.8 g/dL (ref 31.5–35.7)
MCV: 90 fL (ref 79–97)
Monocytes Absolute: 0.3 10*3/uL (ref 0.1–0.9)
Monocytes: 5 %
Neutrophils Absolute: 4.2 10*3/uL (ref 1.4–7.0)
Neutrophils: 62 %
Platelets: 237 10*3/uL (ref 150–450)
RBC: 4.51 x10E6/uL (ref 3.77–5.28)
RDW: 12.8 % (ref 11.7–15.4)
WBC: 6.8 10*3/uL (ref 3.4–10.8)

## 2022-04-19 NOTE — Progress Notes (Signed)
.     Subjective:    Patient ID: Denise Ruiz, female   DOB: 1976-02-22, 47 y.o.   MRN: [id number]   HPI  Tildon Husky interprets  CPE without pap  1.  Pap:  last performed 03/2020 and normal.    2.  Mammogram:  Last performed 03/09/2022 along with [place] supporting cyst in left breast.  Recommended repeat in usual 1 year.    3.  Osteoprevention:  Taking a calcium/zinc/magnesium supplement and Vitamin D (2000 units) daily separately from [place].  She does not know the amounts.  She has not been tested for Vitamin D level.  Walks 2-3 miles 5 days weekly.    4.  Guaiac Cards/FIT:  Last performed 02/22/22 and negative (with BCCCP)  5.  Colonoscopy:  Never.  EGD performed in 2022 with Dr. Ewing Schlein with GERD symptoms not amenable to conservative treatment.  Biopsy of antrum showed chronic gastritis with negative H. Pylori.  6.  Immunizations:  She has not obtained influenza nor COVID boosters for this season.  Husband very ill with influenza recently.  States she just wanted to live a healthy lifestyle and see if that keeps her immune system strong.   Immunization History  Administered Date(s) Administered   Influenza Inj Mdck Quad With Preservative 01/27/2020   Influenza,inj,Quad PF,6+ Mos 12/11/2014, 01/09/2019   Influenza-Unspecified 01/09/2019   Moderna Sars-Covid-2 Vaccination 08/04/2019, 09/01/2019   Tdap 12/11/2014     7.  Glucose/Cholesterol:  A1C 10/25/2021 in normal range at 5.5%.  Dyslipidemia with low HDL, though improved from hyperlipidemia 1 year ago.   Lipid Panel     Component Value Date/Time   CHOL 197 04/18/2022 0844   TRIG 134 04/18/2022 0844   HDL 38 (L) 04/18/2022 0844   CHOLHDL 5.9 05/03/2018 0941   VLDL 21 05/03/2018 0941   LDLCALC 135 (H) 04/18/2022 0844   LABVLDL 24 04/18/2022 0844     Current Meds  Medication Sig   b complex vitamins capsule Take 1 capsule by mouth daily.   cetirizine (ZYRTEC ALLERGY) 10 MG tablet Take 1 tablet (10 mg total) by  mouth daily.   Cholecalciferol (VITAMIN D) 125 MCG (5000 UT) CAPS Take by mouth daily.   diclofenac Sodium (VOLTAREN) 1 % GEL Apply 2 to 4 g up to 4 times daily as needed to areas of pain   fluticasone (FLONASE) 50 MCG/ACT nasal spray Place 2 sprays into both nostrils daily.   ibuprofen (ADVIL) 200 MG tablet 2 a 4 pastillas con comida cada 6 horas a necesita dolor de cabeza   MAGNESIUM PO Take by mouth daily.   Omega-3 Fatty Acids (OMEGA 3 PO) Take by mouth daily.   pantoprazole (PROTONIX) 40 MG tablet Take 40 mg by mouth as needed.   Probiotic Product (PROBIOTIC PO) Take 1 capsule by mouth daily.   Allergies  Allergen Reactions   Kiwi Extract     Throat swells--does not happen all the time   Soy Allergy Other (See Comments)    Throat swells    Past Medical History:  Diagnosis Date   Bilateral ovarian cysts    Chronic gastritis 09/06/2020   On biopsy of antrum, Dr. Ewing Schlein, [facility]   Chronic sinusitis    Dyslipidemia, goal LDL below 130    GERD (gastroesophageal reflux disease)    Hidradenitis axillaris    Varicose vein    Past Surgical History:  Procedure Laterality Date   BIOPSY  08/30/2020   Procedure: BIOPSY;  Surgeon: Vida Rigger,  MD;  Location: [facility]COPY;  Service: Endoscopy;;   ESOPHAGOGASTRODUODENOSCOPY (EGD) WITH PROPOFOL N/A 08/30/2020   Procedure: ESOPHAGOGASTRODUODENOSCOPY (EGD) WITH PROPOFOL;  Surgeon: Vida Rigger, MD;  Location: [facility] ENDOSCOPY;  Service: Endoscopy;  Laterality: N/A;   Family History  Problem Relation Age of Onset   Hyperlipidemia Mother    Gallbladder disease Mother    Multiple sclerosis Mother    Thyroid disease Sister    Arthritis Brother        Rheumatoid   Congenital heart disease Daughter        ?VSD vs. ASD-repaired.   Hip dysplasia Daughter        congenital   Scoliosis Daughter    Other Daughter        Ureteral reflux   Other Son        Recurrent ear infections--plans for T tubes at age 68 yo   Heart attack Paternal Aunt     Heart attack Paternal Aunt    Heart attack Paternal Uncle    Cancer Maternal Grandmother        liver   Breast cancer Cousin    Cancer Cousin        Breast cancer:  74, 60,    Social History   Socioeconomic History   Marital status: Married    Spouse name: Occupational psychologist   Number of children: 4   Years of education: 12   Highest education level: High school graduate  Occupational History   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  Tobacco Use   Smoking status: Never    Passive exposure: Never   Smokeless tobacco: Never  Vaping Use   Vaping Use: Never used  Substance and Sexual Activity   Alcohol use: No   Drug use: No   Sexual activity: Yes    Birth control/protection: None  Other Topics Concern   Not on file  Social History Narrative   Lives at home with husband and 4 children   Social Determinants of Health   Financial Resource Strain: Low Risk  (04/08/2021)   Overall Financial Resource Strain (CARDIA)    Difficulty of Paying Living Expenses: Not hard at all  Food Insecurity: No Food Insecurity (02/21/2022)   Hunger Vital Sign    Worried About Running Out of Food in the Last Year: Never true    Ran Out of Food in the Last Year: Never true  Transportation Needs: No Transportation Needs (02/21/2022)   PRAPARE - Administrator, Civil Service (Medical): No    Lack of Transportation (Non-Medical): No  Physical Activity: Insufficiently Active (04/03/2019)   Exercise Vital Sign    Days of Exercise per Week: 2 days    Minutes of Exercise per Session: 30 min  Stress: No Stress Concern Present (04/03/2019)   [employer] of Occupational Health - Occupational Stress Questionnaire    Feeling of Stress : Only a little  Social Connections: Moderately Integrated (03/08/2017)   Social Connection and Isolation Panel [NHANES]    Frequency of Communication with Friends and Family: More than three times a week    Frequency of Social Gatherings with Friends and Family: Once a  week    Attends Religious Services: 1 to 4 times per year    Active Member of [employer] or Organizations: No    Attends Banker Meetings: Never    Marital Status: Married  Catering manager Violence: Not At Risk (04/08/2021)   Humiliation, Afraid, Rape, and Kick questionnaire    Fear of Current  or Ex-Partner: No    Emotionally Abused: No    Physically Abused: No    Sexually Abused: No        Review of Systems  HENT:  Negative for dental problem (Has dental visit today for cleaning.).   Eyes:  Positive for visual disturbance (A bit blurry which improves with reading glasses.).  Respiratory:  Negative for shortness of breath.   Cardiovascular:  Positive for chest pain (See visit from 01/2022:  has not heard from [facility].  Diclofenac helps.  Massage helps on her back.). Negative for palpitations and leg swelling.  Gastrointestinal:  Negative for blood in stool (No melena.).  Genitourinary:  Positive for menstrual problem (See history from 10/2021 with menometrorrghia.  She did have pelvic US, but not in her care everywhere tab from [place].  No longer having the right pelvic pain--she thinks still had with pelvic US.).      Objective:   BP 104/64 (BP Location: Left Arm, Patient Position: Sitting, Cuff Size: Normal)   Pulse 72   Resp 16   Ht 4' 10.75" (1.492 m)   Wt 136 lb (61.7 kg)   LMP 03/19/2022   BMI 27.70 kg/m   Physical Exam Constitutional:      Appearance: Normal appearance.  HENT:     Head: Normocephalic and atraumatic.     Right Ear: Tympanic membrane, ear canal and external ear normal.     Left Ear: Tympanic membrane, ear canal and external ear normal.     Nose: Nose normal.     Mouth/Throat:     Mouth: Mucous membranes are moist.     Pharynx: Oropharynx is clear.  Eyes:     Extraocular Movements: Extraocular movements intact.     Conjunctiva/sclera: Conjunctivae normal.     Pupils: Pupils are equal, round, and reactive to light.     Comments:  Discs sharp  Neck:     Thyroid: No thyroid mass or thyromegaly.  Cardiovascular:     Rate and Rhythm: Normal rate and regular rhythm.     Heart sounds: S1 normal and S2 normal. No murmur heard.    No friction rub. No S3 or S4 sounds.     Comments: No carotid bruits.  Carotid, radial, femoral, DP and PT pulses normal and equal.   Pulmonary:     Effort: Pulmonary effort is normal.     Breath sounds: Normal breath sounds and air entry.  Chest:  Breasts:    Right: No inverted nipple, mass or nipple discharge.     Left: No inverted nipple, mass or nipple discharge.  Abdominal:     General: Bowel sounds are normal.     Palpations: Abdomen is soft. There is no hepatomegaly, splenomegaly or mass.     Tenderness: There is no abdominal tenderness.     Hernia: No hernia is present.  Genitourinary:    General: Normal vulva.     Uterus: Normal. Not tender.      Adnexa:        Right: No mass or tenderness.         Left: No mass or tenderness.    Musculoskeletal:        General: Normal range of motion.     Cervical back: Normal range of motion and neck supple.     Right lower leg: No edema.     Left lower leg: No edema.  Lymphadenopathy:     Head:     Right side of head:  No submental or submandibular adenopathy.     Left side of head: No submental or submandibular adenopathy.     Cervical: No cervical adenopathy.     Upper Body:     Right upper body: No supraclavicular or axillary adenopathy.     Left upper body: No supraclavicular or axillary adenopathy.     Lower Body: No right inguinal adenopathy. No left inguinal adenopathy.  Skin:    General: Skin is warm.     Capillary Refill: Capillary refill takes less than 2 seconds.     Findings: No rash.  Neurological:     General: No focal deficit present.     Mental Status: She is alert and oriented to person, place, and time.     Cranial Nerves: Cranial nerves 2-12 are intact.     Sensory: Sensation is intact.     Motor: Motor  function is intact.     Coordination: Coordination is intact.     Gait: Gait is intact.     Deep Tendon Reflexes: Reflexes are normal and symmetric.  Psychiatric:        Speech: Speech normal.        Behavior: Behavior normal. Behavior is cooperative.      Assessment & Plan   CPE without pap Refuses influenza and COVID vaccines today.  Mammogram at end of year FIT to return in 2 weeks.   2.  Decreased Visual Acuity:  optometry referral

## 2022-04-20 LAB — SPECIMEN STATUS REPORT

## 2022-04-20 LAB — VITAMIN D 25 HYDROXY (VIT D DEFICIENCY, FRACTURES): Vit D, 25-Hydroxy: 45.2 ng/mL (ref 30.0–100.0)

## 2022-10-09 ENCOUNTER — Telehealth: Payer: Self-pay

## 2022-10-09 NOTE — Telephone Encounter (Signed)
Patient would like an appointment for patient has pain on her back on the left side along with her breast, patient states pain runs down to her hand. Patient said the Doctor had told her before that it was a muscular pain but she has months with the pain now , patient has taken ibuprofen for pain but it only helps for a little and pain comes back.    Will call when there is an appointment available.

## 2022-10-09 NOTE — Telephone Encounter (Signed)
Patient has been scheduled

## 2022-10-11 ENCOUNTER — Encounter: Payer: Self-pay | Admitting: Internal Medicine

## 2022-10-11 ENCOUNTER — Ambulatory Visit: Payer: Self-pay | Admitting: Internal Medicine

## 2022-10-11 VITALS — BP 110/70 | HR 65 | Resp 16 | Ht 58.75 in | Wt 138.5 lb

## 2022-10-11 DIAGNOSIS — L989 Disorder of the skin and subcutaneous tissue, unspecified: Secondary | ICD-10-CM

## 2022-10-11 DIAGNOSIS — I83813 Varicose veins of bilateral lower extremities with pain: Secondary | ICD-10-CM

## 2022-10-11 DIAGNOSIS — M546 Pain in thoracic spine: Secondary | ICD-10-CM

## 2022-10-11 DIAGNOSIS — G8929 Other chronic pain: Secondary | ICD-10-CM

## 2022-10-11 DIAGNOSIS — N951 Menopausal and female climacteric states: Secondary | ICD-10-CM

## 2022-10-11 DIAGNOSIS — N939 Abnormal uterine and vaginal bleeding, unspecified: Secondary | ICD-10-CM

## 2022-10-11 NOTE — Addendum Note (Signed)
Addended by: Marcene Duos on: 10/11/2022 04:20 PM   Modules accepted: Orders

## 2022-10-11 NOTE — Patient Instructions (Addendum)
[facility]:  [phone]  [facility] [address]  [place], [place] [address] [phone] 40% Discount for [employer] Patients  Needs to start [facility].  Please recommend dosing for her.

## 2022-10-11 NOTE — Progress Notes (Addendum)
Subjective:    Patient ID: Denise Ruiz, female   DOB: 07-20-1975, 47 y.o.   MRN: [id number]   HPI  Tereasa Coop interprets   Left lateral and posterior thoracic back pain and left breast discomfort:  see visit 02/24/2023.  Started perhaps in October the month before.  Felt to be musculoskeletal and was referred to [facility].  She now states she never heard from them.  Diclofenac gel helped a little bit.  She does note massage of the area helps. The discomfort comes and goes, but she does not pay much attention to it.  Wakes her from sleep most nights and her husband massages and feels better.   Also with discomfort down lateral upper arm and into dorsal forearm. Less discomfort with physical activity and more so when at rest--has to move around for it to feel better.   She also can get numbness and tingling in her fingers.  The middle fingers bother her the most.   She is concerned that 3 of her paternal uncles have developed CAD and what sounds like cardiomyopathy.  Maternal cousin died recently at age 50 with what sounds like a cardiomyopathy.  She was also overweight.  She is not clear about what her other health problems were.   2.  Skin Lesion:  under right lower eyelid adjacent to nose.  Has been itching.  No bleeding.  She is not aware of it getting larger.  She has had for many years.    3.  Varicose Veins:  Bilateral legs.  Legs always tired and ache.  She does wear thigh high compression stockings when not so hot.  She tries to recline and elevate legs when sitting.  4.  Irregular periods with RLQ pain:  the pain has resolved.  Finally received pelvic ultrasound in 12/2021, which showed multiple uterine fibroids with normal right ovary.  Collapsing corpus luteal cyst of left ovary. States she is now having regular periods for 3-4 days and not heavy.  Not painful.  She is having hot flashes and night sweats.  These wake her from sleep as well.      Current Meds   Medication Sig   cetirizine (ZYRTEC ALLERGY) 10 MG tablet Take 1 tablet (10 mg total) by mouth daily.   Cholecalciferol (VITAMIN D) 125 MCG (5000 UT) CAPS Take by mouth daily.   diclofenac Sodium (VOLTAREN) 1 % GEL Apply 2 to 4 g up to 4 times daily as needed to areas of pain   fluticasone (FLONASE) 50 MCG/ACT nasal spray Place 2 sprays into both nostrils daily.   ibuprofen (ADVIL) 200 MG tablet 2 a 4 pastillas con comida cada 6 horas a necesita dolor de cabeza   MAGNESIUM PO Take by mouth daily.   Omega-3 Fatty Acids (OMEGA 3 PO) Take by mouth daily.   pantoprazole (PROTONIX) 40 MG tablet Take 40 mg by mouth as needed.   Probiotic Product (PROBIOTIC PO) Take 1 capsule by mouth daily.   Allergies  Allergen Reactions   Kiwi Extract     Throat swells--does not happen all the time   Soy Allergy Other (See Comments)    Throat swells     Review of Systems    Objective:   BP 110/70 (BP Location: Left Arm, Patient Position: Sitting, Cuff Size: Normal)   Pulse 65   Resp 16   Ht 4' 10.75" (1.492 m)   Wt 138 lb 8 oz (62.8 kg)   BMI 28.21  kg/m   Physical Exam NAD HEENT:  Domed, shiny slightly ruddy skin colored lesion about 4-5 mm in diameter, well circumscribed adjacent to right nose and well below right lower eyelid.  Mild erythema surrounding.   Neck:  NT over spinous processes of cervical and thoracic spine. Lungs:  CTA CV:  RRR without murmur or rub. Chest/back:  tender with palpation of musculature medial to scapula on left, left trap and palpation of posterior axillary muscles as well as pectoral muscles.  Reproduces pain.  NT over underlying ribs in area.     Assessment & Plan  Muscular pain of left axillary and left upper thoracic back:  Xray of thoracic spine with focus on distal C spine and upper T spine.  Re refer to [facility].    2.  Skin lesion of right face:  will check with [facility] to see if Derm would be willing to remove lesion with relatively  new change of itching.    3. Varicose veins:  Generally, performing conservative care appropriately without improvement of discomfort.   Will see if can get into interventional radiology for evaluation and treatment and apply for [facility] Financial assistance.  4.  AUB:  resolved, but symptomatic with perimenopause--[facility] black cohos.

## 2023-01-30 ENCOUNTER — Telehealth: Payer: Self-pay

## 2023-01-30 NOTE — Telephone Encounter (Signed)
Patient called to request refill on pantoprazole 40mg  tabs. Patient reports that for the past few days she has had problems with reflux and requested medication usually help with complaint. [facility].

## 2023-02-01 ENCOUNTER — Other Ambulatory Visit: Payer: Self-pay | Admitting: Internal Medicine

## 2023-02-01 DIAGNOSIS — I83813 Varicose veins of bilateral lower extremities with pain: Secondary | ICD-10-CM

## 2023-02-02 ENCOUNTER — Other Ambulatory Visit: Payer: Self-pay

## 2023-02-02 MED ORDER — PANTOPRAZOLE SODIUM 40 MG PO TBEC: 40.0000 mg | DELAYED_RELEASE_TABLET | ORAL | 2 refills | Status: DC | PRN

## 2023-02-02 NOTE — Telephone Encounter (Signed)
Rx sent and patient has been notified.  

## 2023-02-19 ENCOUNTER — Other Ambulatory Visit: Payer: Self-pay | Admitting: Internal Medicine

## 2023-02-19 ENCOUNTER — Ambulatory Visit
Admission: RE | Admit: 2023-02-19 | Discharge: 2023-02-19 | Disposition: A | Discharge status: Home / Self Care | Payer: Self-pay | Source: Ambulatory Visit | Attending: Internal Medicine | Admitting: Internal Medicine

## 2023-02-19 ENCOUNTER — Ambulatory Visit
Admission: RE | Admit: 2023-02-19 | Discharge: 2023-02-19 | Disposition: A | Discharge status: Home / Self Care | Payer: No Typology Code available for payment source | Source: Ambulatory Visit | Attending: Internal Medicine | Admitting: Internal Medicine

## 2023-02-19 DIAGNOSIS — I83813 Varicose veins of bilateral lower extremities with pain: Secondary | ICD-10-CM

## 2023-02-19 DIAGNOSIS — I872 Venous insufficiency (chronic) (peripheral): Secondary | ICD-10-CM

## 2023-03-05 NOTE — Progress Notes (Signed)
Spoke with patient via [employer] [phone]. Per patient she is still having pain in the left breast that comes and goes and is tender with touch. Patient states that she can also still feel the cyst in the left breast that was found during her previous mammogram. Patient states that the pain is still the same and that the size of the cyst has not changed. Informed patient that based on the recommendations from her previous mammogram and what she told me she is good for a screening mammogram only. Scheduling will reach out to patient with her [facility] appointment information. Patient voiced understanding.

## 2023-03-14 ENCOUNTER — Other Ambulatory Visit: Payer: Self-pay

## 2023-04-04 ENCOUNTER — Other Ambulatory Visit: Payer: Self-pay | Admitting: Obstetrics and Gynecology

## 2023-04-04 DIAGNOSIS — Z1231 Encounter for screening mammogram for malignant neoplasm of breast: Secondary | ICD-10-CM

## 2023-04-11 ENCOUNTER — Ambulatory Visit
Admission: RE | Admit: 2023-04-11 | Discharge: 2023-04-11 | Disposition: A | Discharge status: Home / Self Care | Payer: Self-pay | Source: Ambulatory Visit | Attending: Internal Medicine | Admitting: Internal Medicine

## 2023-04-11 DIAGNOSIS — I872 Venous insufficiency (chronic) (peripheral): Secondary | ICD-10-CM

## 2023-04-11 HISTORY — PX: IR RADIOLOGIST EVAL & MGMT: IMG5224

## 2023-04-11 NOTE — Consult Note (Signed)
 Chief Complaint: Varicose veins  Referring Physician(s): Mulberry,Elizabeth  History of Present Illness: Denise Ruiz is a 48 y.o. female presenting today as a consult to [facility], kindly referred by Dr. Jayne Mews, for evaluation of varicose veins.    Denise Ruiz joins us  today by herself for the interview, which was performed with a Spanish interpreter.    Her main complaint is that she has been having worsening varicose veins of the bilateral legs over time.  She complains of pain in both legs with the VV's.  She also complains of swelling, worst at the end of the day, heaviness, worst at the end of the day, redness and itching.  The symptoms are bilateral and symmetric.  Symptoms are typically better in the morning after sleep.  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 She has not been wearing compression stockings.    She has no other significant medical problems, with no heart history or history of stroke.  She has no history of DVT/PE.   Duplex with reflux exam was performed 02/19/23. This shows bilateral GSV reflux, with associated varicose pathways bilateral.    Past Medical History:  Diagnosis Date   Bilateral ovarian cysts    Chronic gastritis 09/06/2020   On biopsy of antrum, Dr. Lavaughn Portland, [facility]   Chronic sinusitis    Dyslipidemia, goal LDL below 130    GERD (gastroesophageal reflux disease)    Hidradenitis axillaris    Varicose vein     Past Surgical History:  Procedure Laterality Date   BIOPSY  08/30/2020   Procedure: BIOPSY;  Surgeon: Ozell Blunt, MD;  Location: [facility];  Service: Endoscopy;;   ESOPHAGOGASTRODUODENOSCOPY (EGD) WITH PROPOFOL  N/A 08/30/2020   Procedure: ESOPHAGOGASTRODUODENOSCOPY (EGD) WITH PROPOFOL ;  Surgeon: Ozell Blunt, MD;  Location: [facility];  Service: Endoscopy;  Laterality: N/A;    Allergies: Kiwi extract and Soy allergy (do not select)  Medications: Prior to Admission medications   Medication Sig  Start Date End Date Taking? Authorizing Provider  b complex vitamins capsule Take 1 capsule by mouth daily. Patient not taking: Reported on 10/11/2022    [provider]  cetirizine  (ZYRTEC  ALLERGY) 10 MG tablet Take 1 tablet (10 mg total) by mouth daily. 06/18/20   Ronalee Cocking, MD  Cholecalciferol (VITAMIN D ) 125 MCG (5000 UT) CAPS Take by mouth daily.    [provider]  diclofenac  Sodium (VOLTAREN ) 1 % GEL Apply 2 to 4 g up to 4 times daily as needed to areas of pain 02/23/22   Ronalee Cocking, MD  fluticasone  (FLONASE ) 50 MCG/ACT nasal spray Place 2 sprays into both nostrils daily. 07/22/19   Ronalee Cocking, MD  ibuprofen  (ADVIL ) 200 MG tablet 2 a 4 pastillas con comida cada 6 horas a necesita dolor de cabeza 04/19/21   Ronalee Cocking, MD  MAGNESIUM PO Take by mouth daily.    [provider]  Omega-3 Fatty Acids (OMEGA 3 PO) Take by mouth daily.    [provider]  pantoprazole  (PROTONIX ) 40 MG tablet Take 1 tablet (40 mg total) by mouth as needed. 02/02/23   Ronalee Cocking, MD  Probiotic Product (PROBIOTIC PO) Take 1 capsule by mouth daily.    [provider]     Family History  Problem Relation Age of Onset   Hyperlipidemia Mother    Gallbladder disease Mother    Multiple sclerosis Mother    Thyroid disease Sister    Arthritis Brother  Rheumatoid   Congenital heart disease Daughter        ?VSD vs. ASD-repaired.   Hip dysplasia Daughter        congenital   Scoliosis Daughter    Other Daughter        Ureteral reflux   Other Son        Recurrent ear infections--plans for T tubes at age 82 yo   Heart attack Paternal Aunt    Heart attack Paternal Aunt    Heart attack Paternal Uncle    Cancer Maternal Grandmother        liver   Breast cancer Cousin    Cancer Cousin        Breast cancer:  63, 60,     Social History   Socioeconomic History   Marital status: Married    Spouse name: Occupational psychologist    Number of children: 4   Years of education: 12   Highest education level: High school graduate  Occupational History   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  Tobacco Use   Smoking status: Never    Passive exposure: Never   Smokeless tobacco: Never  Vaping Use   Vaping status: Never Used  Substance and Sexual Activity   Alcohol use: No   Drug use: No   Sexual activity: Yes    Birth control/protection: None  Other Topics Concern   Not on file  Social History Narrative   Lives at home with husband and 4 children   Social Drivers of Corporate investment banker Strain: Low Risk  (04/08/2021)   Overall Financial Resource Strain (CARDIA)    Difficulty of Paying Living Expenses: Not hard at all  Food Insecurity: No Food Insecurity (02/21/2022)   Hunger Vital Sign    Worried About Running Out of Food in the Last Year: Never true    Ran Out of Food in the Last Year: Never true  Transportation Needs: No Transportation Needs (02/21/2022)   PRAPARE - Administrator, Civil Service (Medical): No    Lack of Transportation (Non-Medical): No  Physical Activity: Insufficiently Active (04/03/2019)   Exercise Vital Sign    Days of Exercise per Week: 2 days    Minutes of Exercise per Session: 30 min  Stress: No Stress Concern Present (04/03/2019)   [employer] of Occupational Health - Occupational Stress Questionnaire    Feeling of Stress : Only a little  Social Connections: Unknown (01/09/2022)   Received from [facility]   Social Network    Social Network: Not on file       Review of Systems: A 12 point ROS discussed and pertinent positives are indicated in the HPI above.  All other systems are negative.  Review of Systems  Vital Signs: BP 132/79   Pulse 67   Temp 98.2 F (36.8 C)   Resp 20   SpO2 99%     Physical Exam General: 48 yo female appearing stated age.  Well-developed, well-nourished.  No distress. HEENT: Atraumatic, normocephalic.  Conjugate gaze,  extra-ocular motor intact. No scleral icterus or scleral injection. No lesions on external ears, nose, lips, or gums.  Oral mucosa moist, pink.  Neck: Symmetric with no goiter enlargement.  Chest/Lungs:  Symmetric chest with inspiration/expiration.  No labored breathing.    Heart:    No JVD appreciated.  Abdomen:  Soft, NT/ND, with + bowel sounds.   Genito-urinary: Deferred Neurologic: Alert & Oriented to person, place, and time.   Normal affect and insight.  Appropriate questions.  Moving all 4 extremities with gross sensory intact.  Extremities: Bilateral varicosities  Mallampati Score:     Imaging: No results found.  Labs:  CBC: Recent Labs    04/18/22 0844  WBC 6.8  HGB 13.7  HCT 40.5  PLT 237    COAGS: No results for input(s): "INR", "APTT" in the last 8760 hours.  BMP: Recent Labs    04/18/22 0844  NA 137  K 4.1  CL 99  CO2 25  GLUCOSE 96  BUN 12  CALCIUM 9.1  CREATININE 0.71    LIVER FUNCTION TESTS: Recent Labs    04/18/22 0844  BILITOT 0.3  AST 16  ALT 14  ALKPHOS 48  PROT 6.8  ALBUMIN 4.5    TUMOR MARKERS: No results for input(s): "AFPTM", "CEA", "CA199", "CHROMGRNA" in the last 8760 hours.  Assessment and Plan:  Denise Ruiz is a very pleasant 48 yo female with bilateral chronic venous insufficiency, classic HASTI type symptoms.   On the right C: 3,4; E:p; A:s; P:r On the left C: 3,4; E:p; A:s; P:r  I had a conversation with her via interpreter regarding the venous anatomy, pathology/pathophysiology, natural history, historical treatments, foundation treatments, and role of ablation/sclerotherapy.   I did let her know that for her there may be a role of ablation/sclerotherapy, however, we will do a trial of compression stockings first.   We discussed thigh-high or pantyhose style compression stockings, firm. I did let her know where she might be able to obtain some compression stockings. She understands.   Plan: - 3 month trial of  compression stockings - 3 month follow up with Dr. Mabel Savage to discuss response of symptoms, with Spanish interpreter  Thank you for this interesting consult.  I greatly enjoyed meeting Ivery Maribel Elam and look forward to participating in their care.  A copy of this report was sent to the requesting provider on this date.  Electronically Signed: Myrlene Asper 04/11/2023, 11:27 AM   I spent a total of  40 Minutes   in face to face in clinical consultation, greater than 50% of which was counseling/coordinating care for bilateral venous insufficiency, initiatl management

## 2023-04-20 ENCOUNTER — Encounter: Payer: Self-pay | Admitting: Internal Medicine

## 2023-04-24 ENCOUNTER — Other Ambulatory Visit: Payer: Self-pay

## 2023-04-24 DIAGNOSIS — Z1329 Encounter for screening for other suspected endocrine disorder: Secondary | ICD-10-CM

## 2023-04-24 DIAGNOSIS — Z79899 Other long term (current) drug therapy: Secondary | ICD-10-CM

## 2023-04-24 DIAGNOSIS — E782 Mixed hyperlipidemia: Secondary | ICD-10-CM

## 2023-04-25 ENCOUNTER — Encounter: Payer: Self-pay | Admitting: Internal Medicine

## 2023-04-25 LAB — LIPID PANEL W/O CHOL/HDL RATIO
Cholesterol, Total: 230 mg/dL — ABNORMAL HIGH (ref 100–199)
HDL: 42 mg/dL (ref >39)
LDL Chol Calc (NIH): 170 mg/dL — ABNORMAL HIGH (ref 0–99)
Triglycerides: 102 mg/dL (ref 0–149)
VLDL Cholesterol Cal: 18 mg/dL (ref 5–40)

## 2023-04-25 LAB — CBC WITH DIFFERENTIAL/PLATELET
Basophils Absolute: 0 10*3/uL (ref 0.0–0.2)
Basos: 0 %
EOS (ABSOLUTE): 0.1 10*3/uL (ref 0.0–0.4)
Eos: 1 %
Hematocrit: 40.9 % (ref 34.0–46.6)
Hemoglobin: 14.2 g/dL (ref 11.1–15.9)
Immature Grans (Abs): 0 10*3/uL (ref 0.0–0.1)
Immature Granulocytes: 0 %
Lymphocytes Absolute: 2.3 10*3/uL (ref 0.7–3.1)
Lymphs: 41 %
MCH: 31.6 pg (ref 26.6–33.0)
MCHC: 34.7 g/dL (ref 31.5–35.7)
MCV: 91 fL (ref 79–97)
Monocytes Absolute: 0.3 10*3/uL (ref 0.1–0.9)
Monocytes: 5 %
Neutrophils Absolute: 2.9 10*3/uL (ref 1.4–7.0)
Neutrophils: 53 %
Platelets: 284 10*3/uL (ref 150–450)
RBC: 4.49 x10E6/uL (ref 3.77–5.28)
RDW: 12.3 % (ref 11.7–15.4)
WBC: 5.6 10*3/uL (ref 3.4–10.8)

## 2023-04-25 LAB — COMPREHENSIVE METABOLIC PANEL
ALT: 14 [IU]/L (ref 0–32)
AST: 16 [IU]/L (ref 0–40)
Albumin: 4.2 g/dL (ref 3.9–4.9)
Alkaline Phosphatase: 52 [IU]/L (ref 44–121)
BUN/Creatinine Ratio: 16 (ref 9–23)
BUN: 10 mg/dL (ref 6–24)
Bilirubin Total: 0.4 mg/dL (ref 0.0–1.2)
CO2: 27 mmol/L (ref 20–29)
Calcium: 9.6 mg/dL (ref 8.7–10.2)
Chloride: 99 mmol/L (ref 96–106)
Creatinine, Ser: 0.64 mg/dL (ref 0.57–1.00)
Globulin, Total: 2.6 g/dL (ref 1.5–4.5)
Glucose: 92 mg/dL (ref 70–99)
Potassium: 4.6 mmol/L (ref 3.5–5.2)
Sodium: 138 mmol/L (ref 134–144)
Total Protein: 6.8 g/dL (ref 6.0–8.5)
eGFR: 110 mL/min/{1.73_m2} (ref >59)

## 2023-04-25 LAB — TSH: TSH: 1.86 u[IU]/mL (ref 0.450–4.500)

## 2023-05-24 ENCOUNTER — Encounter: Payer: Self-pay | Admitting: Internal Medicine

## 2023-05-24 ENCOUNTER — Ambulatory Visit: Payer: Self-pay | Admitting: Internal Medicine

## 2023-05-24 VITALS — BP 110/64 | HR 76 | Resp 16 | Ht 58.75 in | Wt 131.0 lb

## 2023-05-24 DIAGNOSIS — N939 Abnormal uterine and vaginal bleeding, unspecified: Secondary | ICD-10-CM

## 2023-05-24 LAB — POCT URINALYSIS DIPSTICK
Bilirubin, UA: NEGATIVE
Blood, UA: NEGATIVE
Glucose, UA: NEGATIVE
Ketones, UA: NEGATIVE
Leukocytes, UA: NEGATIVE
Nitrite, UA: NEGATIVE
Protein, UA: NEGATIVE
Spec Grav, UA: 1.02 (ref 1.010–1.025)
Urobilinogen, UA: 0.2 U/dL
pH, UA: 6 (ref 5.0–8.0)

## 2023-05-24 NOTE — Progress Notes (Signed)
    Subjective:    Patient ID: Denise Ruiz, female   DOB: 09-May-1975, 48 y.o.   MRN: [id number]   HPI  Denise Ruiz interprets.  Started period 15 days ago.  First 4 days was normal as usual, but has continued to flow, though gradually less.  Periods have been regular and this one started at about the right time.   Having a little bit of discomfort in her right pelvis.  This also is decreasing with time.   She does have a similar history of prolonged period from 10/2021 and pelvic US showed multiple uterine fibroids. She is also having suprapubic burning with urination--started before her period started.  Not with each urination. No fever.   No flank pain.  Current Meds  Medication Sig   b complex vitamins capsule Take 1 capsule by mouth daily.   cetirizine (ZYRTEC ALLERGY) 10 MG tablet Take 1 tablet (10 mg total) by mouth daily.   Cholecalciferol (VITAMIN D) 125 MCG (5000 UT) CAPS Take by mouth daily.   diclofenac Sodium (VOLTAREN) 1 % GEL Apply 2 to 4 g up to 4 times daily as needed to areas of pain   fluticasone (FLONASE) 50 MCG/ACT nasal spray Place 2 sprays into both nostrils daily.   ibuprofen (ADVIL) 200 MG tablet 2 a 4 pastillas con comida cada 6 horas a necesita dolor de cabeza   MAGNESIUM PO Take by mouth daily.   Omega-3 Fatty Acids (OMEGA 3 PO) Take by mouth daily.   pantoprazole (PROTONIX) 40 MG tablet Take 1 tablet (40 mg total) by mouth as needed.   Probiotic Product (PROBIOTIC PO) Take 1 capsule by mouth daily.   Allergies  Allergen Reactions   Kiwi Extract     Throat swells--does not happen all the time   Soy Allergy (Obsolete) Other (See Comments)    Throat swells     Review of Systems    Objective:   BP 110/64 (BP Location: Left Arm, Patient Position: Sitting, Cuff Size: Normal)   Pulse 76   Resp 16   Ht 4' 10.75" (1.492 m)   Wt 131 lb (59.4 kg)   LMP 05/09/2023   BMI 26.68 kg/m   Physical Exam NAD Lungs:  CTA CV:  RRR without  murmur or rub.  Radial pulses normal and equal Abd:  S, + BS, Mild RLQ tenderness no rebound or peritoneal signs.  No HSM or mass.  UA normal save for blood.  Assessment & Plan   Dysfunctional Uterine bleeding:  history of similar episode with pelvic US showing multiple fibroids in 2023.  Discussed option of taking Medroxyprogesterone 10 mg daily for 10 days to stop bleeding or just allow to gradually resolve as seems to be doing. She elected no med for now and will call in a week for meds if still an issue. To call if any new symptoms.

## 2023-05-31 ENCOUNTER — Ambulatory Visit: Payer: Self-pay | Admitting: *Deleted

## 2023-05-31 ENCOUNTER — Ambulatory Visit
Admission: RE | Admit: 2023-05-31 | Discharge: 2023-05-31 | Disposition: A | Discharge status: Home / Self Care | Payer: Self-pay | Source: Ambulatory Visit | Attending: Obstetrics and Gynecology | Admitting: Obstetrics and Gynecology

## 2023-05-31 VITALS — BP 110/63 | Wt 133.0 lb

## 2023-05-31 DIAGNOSIS — Z01419 Encounter for gynecological examination (general) (routine) without abnormal findings: Secondary | ICD-10-CM

## 2023-05-31 DIAGNOSIS — Z1211 Encounter for screening for malignant neoplasm of colon: Secondary | ICD-10-CM

## 2023-05-31 DIAGNOSIS — Z1231 Encounter for screening mammogram for malignant neoplasm of breast: Secondary | ICD-10-CM

## 2023-05-31 NOTE — Patient Instructions (Signed)
 Explained breast self awareness with [name]. Pap smear completed today. Let her know [facility] will cover Pap smears and HPV typing every 5 years unless has a history of abnormal Pap smears. Referred [facility] to the [facility] for a screening mammogram on mobile unit. Appointment scheduled Thursday, May 31, 2023 at 1130. Patient aware of appointment and will be there. Let patient know will follow up with her within the next couple weeks with results of Pap smear by letter or phone. Informed patient that the Breast Center will follow up with her within the next couple of weeks with results of her mammogram by letter or phone. Denise Ruiz verbalized understanding.  Denise Ruiz, Denise Maser, RN 11:17 AM

## 2023-05-31 NOTE — Progress Notes (Signed)
 Denise Ruiz is a 48 y.o. [phone] female who presents to [facility] clinic today with no complaints.    Pap Smear: Pap smear completed today. Last Pap smear was 04/08/2020 at [facility] clinic and was normal. Per patient has no history of an abnormal Pap smear. Last Pap smear result is available in Epic.    Physical exam: Breasts Breasts symmetrical. No skin abnormalities bilateral breasts. No nipple retraction bilateral breasts. No nipple discharge bilateral breasts. No lymphadenopathy. No lumps palpated bilateral breasts. No complaints of pain or tenderness on exam.     MS DIGITAL DIAG TOMO BILAT Result Date: 03/09/2022 CLINICAL DATA:  48 year old female presenting for evaluation of diffuse left breast pain. Additionally the patient's physician reports a possible lump in the upper central left breast. EXAM: DIGITAL DIAGNOSTIC BILATERAL MAMMOGRAM WITH TOMOSYNTHESIS; ULTRASOUND LEFT BREAST LIMITED TECHNIQUE: Bilateral digital diagnostic mammography and breast tomosynthesis was performed.; Targeted ultrasound examination of the left breast was performed. COMPARISON:  Previous exam(s). ACR Breast Density Category d: The breast tissue is extremely dense, which lowers the sensitivity of mammography. FINDINGS: Mammogram: Right breast: No suspicious mass, distortion, or microcalcifications are identified to suggest presence of malignancy. Left breast: No suspicious mass, distortion, or microcalcifications are identified to suggest presence of malignancy. Specifically there is no discrete mass in the superior central left breast. On physical exam of the superior central left breast I do not feel a discrete mass or focal area of thickening. Ultrasound: Targeted ultrasound performed in the left breast at 12 o'clock 2 cm from the nipple demonstrating 2 adjacent oval circumscribed anechoic masses consistent with benign simple cysts. The larger measures up to 0.9 cm. No suspicious solid mass. IMPRESSION: 1.  Benign subcentimeter simple cyst in the superior central left breast. Patient's left breast pain may be due to fibrocystic changes. 2.  No mammographic evidence of malignancy bilaterally. RECOMMENDATION: Screening mammogram in one year.(Code:SM-B-01Y) I have discussed the findings and recommendations with the patient. If applicable, a reminder letter will be sent to the patient regarding the next appointment. BI-RADS CATEGORY  2: Benign. Electronically Signed   By: Emmaline Kluver M.D.   On: 03/09/2022 14:21   MS DIGITAL DIAG TOMO BILAT Result Date: 09/02/2020 CLINICAL DATA:  Mass felt by the patient in the left axilla for the past month. EXAM: DIGITAL DIAGNOSTIC BILATERAL MAMMOGRAM WITH TOMOSYNTHESIS AND CAD; [place] AXILLARY LEFT TECHNIQUE: Bilateral digital diagnostic mammography and breast tomosynthesis was performed. The images were evaluated with computer-aided detection.; Targeted ultrasound examination of the left axilla was performed. COMPARISON:  Previous exam(s). ACR Breast Density Category d: The breast tissue is extremely dense, which lowers the sensitivity of mammography. FINDINGS: Stable mammographic appearance of the breasts with no interval findings suspicious for malignancy in either breast. No mammographically visible left axillary abnormality at the location of the mass felt by the patient, marked with a metallic marker. On physical exam, the patient has an obliquely oriented ridge of mildly thickened skin in the left axilla at the location of a skin crease. This corresponds to the mass felt by the patient. There is no actual palpable mass today. Targeted ultrasound is performed, showing mildly diffusely thickened skin corresponding to the thickened skin on physical examination. The underlying subcutaneous fat and muscles have normal appearances. No enlarged lymph nodes. IMPRESSION: No evidence of malignancy. RECOMMENDATION: Bilateral screening mammogram in 1 year. I have discussed the findings  and recommendations with the patient. If applicable, a reminder letter will be sent to the patient regarding  the next appointment. BI-RADS CATEGORY  1: Negative. Electronically Signed   By: Beckie Salts M.D.   On: 09/02/2020 13:35  MS DIGITAL DIAG TOMO UNI LEFT Result Date: 08/05/2019 CLINICAL DATA:  48 year old female with tenderness/burning in the left axillary region. EXAM: DIGITAL DIAGNOSTIC UNILATERAL LEFT MAMMOGRAM WITH CAD AND TOMO ULTRASOUND LEFT AXILLA COMPARISON:  PREVIOUS EXAMS. ACR Breast Density Category c: The breast tissue is heterogeneously dense, which may obscure small masses. FINDINGS: No suspicious masses or calcifications are seen in either breast. Spot compression tangential tomograms were performed over the area of concern in the left axilla with no definite abnormality seen. Mammographic images were processed with CAD. Physical examination of the left axilla does not reveal any palpable masses. The patient is very mildly tender to palpation of the axillary tail region. Targeted ultrasound of the left axilla in region of tenderness/burning sensation was performed. No suspicious masses or abnormality seen. No morphologically abnormal lymph nodes identified. IMPRESSION: No mammographic or sonographic abnormalities to account for left axillary tenderness/burning sensation. RECOMMENDATION: 1. Recommend further management of left axillary tenderness/burning sensation be based on clinical assessment. 2.  Recommend annual routine screening mammography, due June 2021. I have discussed the findings and recommendations with the patient. If applicable, a reminder letter will be sent to the patient regarding the next appointment. BI-RADS CATEGORY  1: Negative. Electronically Signed   By: Edwin Cap M.D.   On: 08/05/2019 12:04   MS DIGITAL SCREENING TOMO BILATERAL Result Date: 09/09/2018 CLINICAL DATA:  Screening. EXAM: DIGITAL SCREENING BILATERAL MAMMOGRAM WITH TOMO AND CAD COMPARISON:   Previous exam(s). ACR Breast Density Category d: The breast tissue is extremely dense, which lowers the sensitivity of mammography FINDINGS: There are no findings suspicious for malignancy. Images were processed with CAD. IMPRESSION: No mammographic evidence of malignancy. A result letter of this screening mammogram will be mailed directly to the patient. RECOMMENDATION: Screening mammogram in one year. (Code:SM-B-01Y) BI-RADS CATEGORY  1: Negative. Electronically Signed   By: Beckie Salts M.D.   On: 09/09/2018 07:50    Pelvic/Bimanual Ext Genitalia No lesions, no swelling and no discharge observed on external genitalia.        Vagina Vagina pink and normal texture. No lesions or discharge observed in vagina.        Cervix Cervix is present. Cervix pink and of normal texture. No discharge observed.    Uterus Uterus is present and palpable. Uterus in normal position and enlarged. Patient has history of uterine fibroids.        Adnexae Bilateral ovaries present and palpable. No tenderness on palpation.         Rectovaginal No rectal exam completed today since patient had no rectal complaints. No skin abnormalities observed on exam.     Smoking History: Patient has never smoked.   Patient Navigation: Patient education provided. Access to services provided for patient through [place] program. Spanish interpreter Natale Lay from [facility] provided.   Colorectal Cancer Screening: Per patient has never had colonoscopy completed. FIT Test completed 02/22/2022 that was negative. FIT Test given to patient today to complete. No complaints today.    Breast and Cervical Cancer Risk Assessment: Patient has family history of three paternal first cousins having breast cancer. Patient has no known genetic mutations or history of radiation treatment to the chest before age 60. Patient does not have history of cervical dysplasia, immunocompromised, or DES exposure in-utero.   Risk Scores as of Encounter  on 05/31/2023     Denise Ruiz  5-year 0.98%   Lifetime 10.33%   This patient is [place]/Latina but has no documented birth country, so the [place] model used data from [place] patients to calculate their risk score. Document a birth country in the Demographics activity for a more accurate score.         Last calculated by Caprice Red, CMA on 05/31/2023 at 10:49 AM        A: BCCCP exam with pap smear No complaints.  P: Ref[facility] to [facility] for a screening mammogram on mobile unit. Appointment scheduled Thursday, May 31, 2023 at 1130.  Priscille Heidelberg, RN 05/31/2023 11:17 AM

## 2023-06-04 LAB — CYTOLOGY - PAP
Comment: NEGATIVE
Diagnosis: NEGATIVE
High risk HPV: NEGATIVE

## 2023-06-07 ENCOUNTER — Encounter: Payer: Self-pay | Admitting: Obstetrics & Gynecology

## 2023-06-09 LAB — FECAL OCCULT BLOOD, IMMUNOCHEMICAL: Fecal Occult Bld: NEGATIVE

## 2023-06-09 LAB — SPECIMEN STATUS REPORT

## 2023-06-19 ENCOUNTER — Telehealth: Payer: Self-pay

## 2023-06-19 NOTE — Telephone Encounter (Signed)
 Patient reported that she is still having problems with face lesions. Patient was never seen through [facility] because referral was rejected. Patient would like to be seen somewhere else.

## 2023-06-19 NOTE — Telephone Encounter (Signed)
 Spoke with Judeth Cornfield from [facility] and asked about the referral, She asked me to send the referral one more time to her and that she will investigate why the referral was rejected and see if it can be scheduled. She stated that if referral is rejected one more time she will then ask what is their criteria and she will let us know.

## 2023-06-19 NOTE — Telephone Encounter (Signed)
 Skin lesion referral should accepted through [facility].

## 2023-06-19 NOTE — Telephone Encounter (Signed)
 Denise Ruiz will call [facility] to find out specific reason that referral was rejected.

## 2023-06-26 NOTE — Telephone Encounter (Signed)
 [facility] emailed me, and let me know that patient has been scheduled with [facility] on 08/03/2023 at 9:10 am. [facility] will be sending out a letter with appointment information to patient. Also, I called patient on 06/25/23 to notify her of the appointment, but patient did not answer I left a voicemail.

## 2023-09-12 ENCOUNTER — Telehealth: Payer: Self-pay

## 2023-09-12 NOTE — Telephone Encounter (Signed)
 Patient has a CPE appointment on 09/24/23 . Patient would like to know if doctor needs labs so she cna come before appointment. Patient would like to get thyroid check.

## 2023-09-24 ENCOUNTER — Ambulatory Visit: Payer: Self-pay | Admitting: Internal Medicine

## 2023-09-24 ENCOUNTER — Encounter: Payer: Self-pay | Admitting: Internal Medicine

## 2023-09-24 VITALS — BP 106/76 | HR 64 | Resp 18 | Ht <= 58 in | Wt 132.0 lb

## 2023-09-24 DIAGNOSIS — Z0000 Encounter for general adult medical examination without abnormal findings: Secondary | ICD-10-CM

## 2023-09-24 DIAGNOSIS — K219 Gastro-esophageal reflux disease without esophagitis: Secondary | ICD-10-CM

## 2023-09-24 MED ORDER — PANTOPRAZOLE SODIUM 40 MG PO TBEC: 40.0000 mg | DELAYED_RELEASE_TABLET | Freq: Two times a day (BID) | ORAL | 6 refills | Status: DC

## 2023-09-24 NOTE — Progress Notes (Signed)
 Subjective:    Patient ID: Denise Ruiz, female   DOB: [birth year], 48 y.o.   MRN: [id number]   HPI  Erminio Bloomer interprets  CPE without pap  1.  Pap:  Had normal pap 05/2023 with [facility] exam.    2.  Mammogram:  Last 05/31/2023 as well with [facility] exam and normal.  History of first female cousins dying of breast cancer, ages 45 and 45.  Apparently, another sister to the two that died with history of breast cancer, but living at 49.    3.  Osteoprevention:  Takes Vitamin D3 5000 units once to several times weekly.  Level was fine in past.  Drinks 2 cups of almond milk daily and willing to increase to 3.   Walks or runs 5 to 6 times weekly for 3 miles.    4.  Guaiac Cards/FIT:  Last with [facility] exam on 06/08/2023.  Negative for occult blood.    5.  Colonoscopy:  never.  No family history of colon cancer.    6.  Immunizations:  Has not had COVID vaccination sine first doses in 2021.  She is not interested in influenza or COVID vaccination.  Feels eating well and taking vitamins is protecting her.  Extended discussion on why vaccines are important that does not move the needle on her accepting vaccination. Immunization History  Administered Date(s) Administered   Influenza Inj Mdck Quad With Preservative 01/27/2020   Influenza,inj,Quad PF,6+ Mos 12/11/2014, 01/09/2019   Influenza-Unspecified 01/09/2019   Moderna Sars-Covid-2 Vaccination 08/04/2019, 09/01/2019   Tdap 12/11/2014     7.  Glucose/Cholesterol:  Glucose and A1C testing in past has been fine.   Cholesterol  high in January.  Lipid Panel     Component Value Date/Time   CHOL 230 (H) 04/24/2023 0849   TRIG 102 04/24/2023 0849   HDL 42 04/24/2023 0849   CHOLHDL 5.9 05/03/2018 0941   VLDL 21 05/03/2018 0941   LDLCALC 170 (H) 04/24/2023 0849   LABVLDL 18 04/24/2023 0849     Current Meds  Medication Sig   b complex vitamins capsule Take 1 capsule by mouth daily.   cetirizine  (ZYRTEC  ALLERGY) 10  MG tablet Take 1 tablet (10 mg total) by mouth daily.   Cholecalciferol (VITAMIN D ) 125 MCG (5000 UT) CAPS Take by mouth daily. (Patient taking differently: Take by mouth daily. Taking anywhere from daily to once weekly)   diclofenac  Sodium (VOLTAREN ) 1 % GEL Apply 2 to 4 g up to 4 times daily as needed to areas of pain   fluticasone  (FLONASE ) 50 MCG/ACT nasal spray Place 2 sprays into both nostrils daily.   ibuprofen  (ADVIL ) 200 MG tablet 2 a 4 pastillas con comida cada 6 horas a necesita dolor de cabeza   MAGNESIUM PO Take by mouth daily.   Omega-3 Fatty Acids (OMEGA 3 PO) Take by mouth daily.   Probiotic Product (PROBIOTIC PO) Take 1 capsule by mouth daily.   Allergies  Allergen Reactions   Kiwi Extract     Throat swells--does not happen all the time   Soy Allergy (Obsolete) Other (See Comments)    Throat swells   Past Medical History:  Diagnosis Date   Bilateral ovarian cysts    Chronic gastritis 09/06/2020   On biopsy of antrum, Dr. Rosalie, [facility]   Chronic sinusitis    GERD (gastroesophageal reflux disease)    Hidradenitis axillaris    Hyperlipidemia    Varicose vein  Past Surgical History:  Procedure Laterality Date   BIOPSY  08/30/2020   Procedure: BIOPSY;  Surgeon: Rosalie Kitchens, MD;  Location: [facility];  Service: Endoscopy;;   ESOPHAGOGASTRODUODENOSCOPY (EGD) WITH PROPOFOL  N/A 08/30/2020   Procedure: ESOPHAGOGASTRODUODENOSCOPY (EGD) WITH PROPOFOL ;  Surgeon: Rosalie Kitchens, MD;  Location: [facility];  Service: Endoscopy;  Laterality: N/A;   IR RADIOLOGIST EVAL & MGMT  04/11/2023   Family History  Problem Relation Age of Onset   Hyperlipidemia Mother    Gallbladder disease Mother    Multiple sclerosis Mother    Thyroid disease Sister    Arthritis Brother        Rheumatoid   Congenital heart disease Daughter        ?VSD vs. ASD-repaired.   Hip dysplasia Daughter        congenital   Scoliosis Daughter    Other Daughter        Ureteral reflux   Other Son         Recurrent ear infections--plans for T tubes at age 40 yo   Heart attack Paternal Aunt    Heart attack Paternal Aunt    Heart disease Paternal Aunt 43       describes enlarged heart   Heart attack Paternal Uncle    Cancer Maternal Grandmother        liver   Breast cancer Cousin    Cancer Cousin        Breast cancer:  46, 60,    Heart disease Maternal Cousin 45       enlarged heart --not clear etiology.  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  Tobacco Use   Smoking status: Never    Passive exposure: Never   Smokeless tobacco: Never  Vaping Use   Vaping status: Never Used  Substance and Sexual Activity   Alcohol use: No   Drug use: No   Sexual activity: Yes    Birth control/protection: Condom  Other Topics Concern   Not on file  Social History Narrative   Lives at home with husband and 4 children   Social Drivers of Corporate investment banker Strain: Low Risk  (04/08/2021)   Overall Financial Resource Strain (CARDIA)    Difficulty of Paying Living Expenses: Not hard at all  Food Insecurity: No Food Insecurity (05/31/2023)   Hunger Vital Sign    Worried About Running Out of Food in the Last Year: Never true    Ran Out of Food in the Last Year: Never true  Transportation Needs: No Transportation Needs (05/31/2023)   PRAPARE - Administrator, Civil Service (Medical): No    Lack of Transportation (Non-Medical): No  Physical Activity: Insufficiently Active (04/03/2019)   Exercise Vital Sign    Days of Exercise per Week: 2 days    Minutes of Exercise per Session: 30 min  Stress: No Stress Concern Present (04/03/2019)   [employer] of Occupational Health - Occupational Stress Questionnaire    Feeling of Stress : Only a little  Social  Connections: Unknown (01/09/2022)   Received from [facility]   Social Network    Social Network: Not on file  Intimate Partner Violence: Unknown (01/09/2022)   [facility] from [facility]  HITS    Physically Hurt: Not on file    Insult or Talk Down To: Not on file    Threaten Physical Harm: Not on file    Scream or Curse: Not on file     Review of Systems  Cardiovascular:        Has a lot of worry about heart and GI illness due to family loss. Pinching in her left chest--points over left breast.  Can have in her left axillary area as well.  No radiation elsewhere. Cannot say when she has it.   She can have when she is just sitting.   She does not note the discomfort when walking as she does daily.   She does not have associated dyspnea.     Gastrointestinal:        Concerned she needs another EGD as a cousin recently died of gastric cancer at age 71 years. For past 3 months, she is complaining of burning pain in epigastrium, despite taking Protonix  or Omeprazole .  Radiating into chest at times.  Also feels a pressure into her throat at times when the epigastric and chest burning is bad.   Can have continuously when bad. She can have the burning when she eats or even if she is late for eating.   No melena or hematochezia. She is under more stress in past months--relates to her cousin No alcohol, tobacco.   Was drinking coffee daily, but stopped 3 months ago with onset of symptoms.   No chocolate. +onions, tomatoes She does use ibuprofen , but maybe once every 2 months.   HOB is elevated, but minimally.   Does not lie down for 2 hours after eating.  Taking PPI every day for about 3 months ago.  Takes in the morning 20 minutes before eating.  Only drinks water with PPI.         Objective:   BP 106/76 (BP Location: Right Arm, Patient Position: Sitting, Cuff Size: Normal)   Pulse 64   Resp 18   Ht 4' 10 (1.473 m)   Wt 132 lb (59.9 kg)   LMP 08/24/2023 (Exact Date)    BMI 27.59 kg/m   Physical Exam HENT:     Head: Normocephalic and atraumatic.     Right Ear: Tympanic membrane, ear canal and external ear normal.     Left Ear: Tympanic membrane, ear canal and external ear normal.     Nose: Nose normal.     Mouth/Throat:     Mouth: Mucous membranes are moist.     Pharynx: Oropharynx is clear.  Eyes:     Extraocular Movements: Extraocular movements intact.     Conjunctiva/sclera: Conjunctivae normal.     Pupils: Pupils are equal, round, and reactive to light.     Comments: Discs sharp  Neck:     Thyroid: No thyroid mass or thyromegaly.  Cardiovascular:     Rate and Rhythm: Normal rate and regular rhythm.     Heart sounds: S1 normal and S2 normal. No murmur heard.    No friction rub. No S3 or S4 sounds.     Comments: No carotid bruits.  Carotid, radial, femoral, DP and PT pulses normal and equal.   Pulmonary:     Effort: Pulmonary effort is normal.     Breath sounds: Normal breath sounds and air entry.  Chest:     Comments: Deferred to [facility] Abdominal:     General: Bowel sounds are normal.     Palpations:  Abdomen is soft. There is no hepatomegaly, splenomegaly or mass.     Tenderness: There is no abdominal tenderness.     Hernia: No hernia is present.  Genitourinary:    Comments: Deferred to [facility] Musculoskeletal:        General: Normal range of motion.     Cervical back: Normal range of motion and neck supple.     Right lower leg: No edema.     Left lower leg: No edema.  Lymphadenopathy:     Head:     Right side of head: No submental or submandibular adenopathy.     Left side of head: No submental or submandibular adenopathy.     Cervical: No cervical adenopathy.     Upper Body:     Right upper body: No supraclavicular or axillary adenopathy.     Left upper body: No supraclavicular or axillary adenopathy.     Lower Body: No right inguinal adenopathy. No left inguinal adenopathy.  Skin:    General: Skin is warm.      Capillary Refill: Capillary refill takes less than 2 seconds.     Findings: No rash.  Neurological:     General: No focal deficit present.     Mental Status: She is alert and oriented to person, place, and time.     Cranial Nerves: Cranial nerves 2-12 are intact.     Sensory: Sensation is intact.     Motor: Motor function is intact.     Coordination: Coordination is intact.     Gait: Gait is intact.     Deep Tendon Reflexes: Reflexes are normal and symmetric.  Psychiatric:        Mood and Affect: Mood normal.        Behavior: Behavior normal.      Assessment & Plan    CPE:  Pap and mammogram complete for year.   FIT done and negative. Declines vaccines in general today despite counseling to her benefit.    2.  GERD:  referral back to Dr Rosalie if possible, but likely will need to go through [place].  GCCN.   Restart Protonix  at 40 mg twice daily on empty stomach.  Elevate HOB  3.  Atypical chest discomfort:  unlikely cardiac origin, particularly as does not occur with physical activity and sounds chest wall in origin.  Would like more information on enlarged hearts of distant family members--sounds multifactorial and also likely related to lifestyle in some.  Will obtain ECG when back in for fasting labs.    4.  Hypercholesterolemia:  return for fasting lipids in 2 weeks

## 2023-09-24 NOTE — Telephone Encounter (Signed)
 Patient has been seen.

## 2023-10-12 ENCOUNTER — Other Ambulatory Visit: Payer: Self-pay

## 2023-10-12 DIAGNOSIS — R0789 Other chest pain: Secondary | ICD-10-CM

## 2023-10-12 DIAGNOSIS — E785 Hyperlipidemia, unspecified: Secondary | ICD-10-CM

## 2023-10-12 NOTE — Progress Notes (Unsigned)
 ECG with minimal Twave changed inferiorly.  No obvious ischemia and history not consistent with cardiac source

## 2023-10-13 LAB — LIPID PANEL W/O CHOL/HDL RATIO
Cholesterol, Total: 189 mg/dL (ref 100–199)
HDL: 42 mg/dL (ref >39)
LDL Chol Calc (NIH): 131 mg/dL — ABNORMAL HIGH (ref 0–99)
Triglycerides: 89 mg/dL (ref 0–149)
VLDL Cholesterol Cal: 16 mg/dL (ref 5–40)

## 2023-11-15 ENCOUNTER — Telehealth: Payer: Self-pay | Admitting: Internal Medicine

## 2023-11-15 NOTE — Telephone Encounter (Signed)
 Patient called and left a voicemail asking for the status of her referral to the gastroenterologist, patient states in the voicemail that she has not received an appointment , patient states she is having stomach ache .  Called patient to notified her we will call specialist office to ask for status of referral .  But patient states she received a call from Gastroenterologist and has been scheduled to see specialist on 12/05/23.

## 2023-11-26 DIAGNOSIS — D120 Benign neoplasm of cecum: Secondary | ICD-10-CM | POA: Insufficient documentation

## 2023-12-05 ENCOUNTER — Other Ambulatory Visit: Payer: Self-pay | Admitting: Gastroenterology

## 2023-12-06 ENCOUNTER — Other Ambulatory Visit: Payer: Self-pay | Admitting: Internal Medicine

## 2023-12-06 NOTE — Progress Notes (Signed)
 Med changes from [place] and provider in [place]

## 2023-12-11 NOTE — Progress Notes (Signed)
 Anesthesia Review:  PCP: elizabeth mulberry  LOV 09/24/23  Cardiologist :  PPM/ ICD: Device Orders: Rep Notified:  Chest x-ray : EKG : 10/12/23  Echo : Stress test: Cardiac Cath :   Activity level:  Sleep Study/ CPAP : Fasting Blood Sugar :      / Checks Blood Sugar -- times a day:    Blood Thinner/ Instructions /Last Dose: ASA / Instructions/ Last Dose :    Needs Spanish Interpreter  12/11/23- Attempted to call pt with Spanish Interpreter on 12/11/2023.  No answer.

## 2023-12-17 ENCOUNTER — Ambulatory Visit: Payer: Self-pay | Admitting: Internal Medicine

## 2023-12-18 ENCOUNTER — Other Ambulatory Visit: Payer: Self-pay

## 2023-12-18 ENCOUNTER — Ambulatory Visit (HOSPITAL_BASED_OUTPATIENT_CLINIC_OR_DEPARTMENT_OTHER): Admitting: Anesthesiology

## 2023-12-18 ENCOUNTER — Ambulatory Visit (HOSPITAL_COMMUNITY): Admitting: Anesthesiology

## 2023-12-18 ENCOUNTER — Encounter (HOSPITAL_COMMUNITY): Payer: Self-pay | Admitting: Gastroenterology

## 2023-12-18 ENCOUNTER — Encounter (HOSPITAL_COMMUNITY)
Admission: RE | Disposition: A | Discharge status: Home / Self Care | Payer: Self-pay | Source: Home / Self Care | Attending: Gastroenterology

## 2023-12-18 ENCOUNTER — Ambulatory Visit (HOSPITAL_COMMUNITY)
Admission: RE | Admit: 2023-12-18 | Discharge: 2023-12-18 | Disposition: A | Discharge status: Home / Self Care | Attending: Gastroenterology | Admitting: Gastroenterology

## 2023-12-18 DIAGNOSIS — K648 Other hemorrhoids: Secondary | ICD-10-CM | POA: Diagnosis not present

## 2023-12-18 DIAGNOSIS — Z1211 Encounter for screening for malignant neoplasm of colon: Secondary | ICD-10-CM | POA: Diagnosis present

## 2023-12-18 DIAGNOSIS — K449 Diaphragmatic hernia without obstruction or gangrene: Secondary | ICD-10-CM | POA: Diagnosis not present

## 2023-12-18 DIAGNOSIS — K219 Gastro-esophageal reflux disease without esophagitis: Secondary | ICD-10-CM | POA: Insufficient documentation

## 2023-12-18 DIAGNOSIS — K644 Residual hemorrhoidal skin tags: Secondary | ICD-10-CM | POA: Diagnosis not present

## 2023-12-18 DIAGNOSIS — D120 Benign neoplasm of cecum: Secondary | ICD-10-CM

## 2023-12-18 HISTORY — PX: ESOPHAGOGASTRODUODENOSCOPY: SHX5428

## 2023-12-18 HISTORY — PX: COLONOSCOPY: SHX5424

## 2023-12-18 SURGERY — COLONOSCOPY
Anesthesia: Monitor Anesthesia Care

## 2023-12-18 MED ORDER — LIDOCAINE 2% (20 MG/ML) 5 ML SYRINGE
INTRAMUSCULAR | Status: DC | PRN
  Administered 2023-12-18: 100 mg via INTRAVENOUS

## 2023-12-18 MED ORDER — PROPOFOL 1000 MG/100ML IV EMUL
INTRAVENOUS | Status: AC
  Filled 2023-12-18: qty 100

## 2023-12-18 MED ORDER — PROPOFOL 10 MG/ML IV BOLUS
INTRAVENOUS | Status: DC | PRN
  Administered 2023-12-18 (×2): 20 mg via INTRAVENOUS
  Administered 2023-12-18: 10 mg via INTRAVENOUS

## 2023-12-18 MED ORDER — PROPOFOL 500 MG/50ML IV EMUL
INTRAVENOUS | Status: DC | PRN
  Administered 2023-12-18: 130 ug/kg/min via INTRAVENOUS

## 2023-12-18 MED ORDER — SODIUM CHLORIDE 0.9 % IV SOLN: INTRAVENOUS | Status: DC

## 2023-12-18 NOTE — Op Note (Signed)
 [facility] Patient Name: Denise Ruiz Procedure Date: 12/18/2023 MRN: [id number] Attending MD: Oliva Boots , MD, [id number] Date of Birth: [birth year] CSN: [id number] Age: 48 Admit Type: Outpatient Procedure:                Colonoscopy Indications:              Screening for colorectal malignant neoplasm, This                            is the patient's first colonoscopy Providers:                Oliva Boots, MD, Jacquelyn Jaci Pierce, RN,                            Coye Bade, Technician Referring MD:              Medicines:                Monitored Anesthesia Care Complications:            No immediate complications. Estimated Blood Loss:     Estimated blood loss: none. Procedure:                Pre-Anesthesia Assessment:                           - Prior to the procedure, a History and Physical                            was performed, and patient medications and                            allergies were reviewed. The patient's tolerance of                            previous anesthesia was also reviewed. The risks                            and benefits of the procedure and the sedation                            options and risks were discussed with the patient.                            All questions were answered, and informed consent                            was obtained. Prior Anticoagulants: The patient has                            taken no anticoagulant or antiplatelet agents. ASA                            Grade Assessment: II - A patient with mild systemic  disease. After reviewing the risks and benefits,                            the patient was deemed in satisfactory condition to                            undergo the procedure.                           - Prior to the procedure, a History and Physical                            was performed, and patient medications and                             allergies were reviewed. The patient's tolerance of                            previous anesthesia was also reviewed. The risks                            and benefits of the procedure and the sedation                            options and risks were discussed with the patient.                            All questions were answered, and informed consent                            was obtained. Prior Anticoagulants: The patient has                            taken no anticoagulant or antiplatelet agents. ASA                            Grade Assessment: II - A patient with mild systemic                            disease. After reviewing the risks and benefits,                            the patient was deemed in satisfactory condition to                            undergo the procedure.                           After obtaining informed consent, the colonoscope                            was passed under direct vision. Throughout the  procedure, the patient's blood pressure, pulse, and                            oxygen saturations were monitored continuously. The                            [id number]-[id number] ([id number]) olympus colonscope was                            introduced through the anus and advanced to the the                            terminal ileum. The colonoscopy was performed                            without difficulty. The patient tolerated the                            procedure well. The quality of the bowel                            preparation was adequate. The terminal ileum,                            ileocecal valve, appendiceal orifice, and rectum                            were photographed. Scope In: 10:39:39 AM Scope Out: 10:57:28 AM Scope Withdrawal Time: 0 hours 13 minutes 34 seconds  Total Procedure Duration: 0 hours 17 minutes 49 seconds  Findings:      External and internal hemorrhoids were found during retroflexion, during        perianal exam and during digital exam. The hemorrhoids were small.      A small polyp was found in the cecum. The polyp was semi-sessile. The       polyp was removed with a cold snare. Resection and retrieval were       complete.      The terminal ileum appeared normal.      The exam was otherwise without abnormality on direct and retroflexion       views. Impression:               - External and internal hemorrhoids.                           - One small polyp in the cecum, removed with a cold                            snare. Resected and retrieved.                           - The examined portion of the ileum was normal.                           - The examination was otherwise normal on direct  and retroflexion views. Moderate Sedation:      Not Applicable - Patient had care per Anesthesia.      Not Applicable - Patient had care per Anesthesia. Recommendation:           - Patient has a contact number available for                            emergencies. The signs and symptoms of potential                            delayed complications were discussed with the                            patient. Return to normal activities tomorrow.                            Written discharge instructions were provided to the                            patient.                           - Soft diet today.                           - Continue present medications.                           - Await pathology results.                           - Repeat colonoscopy in 5-10 years for surveillance                            based on pathology results.                           - Return to GI clinic PRN.                           - Telephone GI clinic for pathology results in 1                            week.                           - Telephone GI clinic if symptomatic PRN. Procedure Code(s):        --- Professional ---                           [phone], Colonoscopy, flexible;  with removal of                            tumor(s), polyp(s), or other lesion(s) by snare  technique Diagnosis Code(s):        --- Professional ---                           Z12.11, Encounter for screening for malignant                            neoplasm of colon                           D12.0, Benign neoplasm of cecum CPT copyright 2022 American [employer]. All rights reserved. The codes documented in this report are preliminary and upon coder review may  be revised to meet current compliance requirements. Oliva Boots, MD 12/18/2023 11:14:56 AM This report has been signed electronically. Number of Addenda: 0

## 2023-12-18 NOTE — Progress Notes (Signed)
 Denise Ruiz 10:18 AM  Subjective: Patient without any problems since we saw her recently in the office and did well with the prep and has no questions  Objective: Vital signs stable afebrile no acute distress exam please see preassessment evaluation  Assessment: Upper tract symptoms and patient due for colonic screening  Plan: Okay to proceed with endoscopy and colonoscopy with anesthesia assistance  [facility]  office [phone] After 5PM or if no answer call [phone]

## 2023-12-18 NOTE — Transfer of Care (Signed)
 Immediate Anesthesia Transfer of Care Note  Patient: Denise Ruiz  Procedure(s) Performed: COLONOSCOPY EGD (ESOPHAGOGASTRODUODENOSCOPY)  Patient Location: Endoscopy Unit  Anesthesia Type:MAC  Level of Consciousness: sedated  Airway & Oxygen Therapy: Patient Spontanous Breathing and Patient connected to face mask oxygen  Post-op Assessment: Report given to RN and Post -op Vital signs reviewed and stable  Post vital signs: Reviewed and stable  Last Vitals:  Vitals Value Taken Time  BP    Temp    Pulse 52 12/18/23 11:04  Resp 24 12/18/23 11:04  SpO2 100 % 12/18/23 11:04  Vitals shown include unfiled device data.  Last Pain:  Vitals:   12/18/23 1006  TempSrc: Temporal  PainSc: 0-No pain         Complications: No notable events documented.

## 2023-12-18 NOTE — Op Note (Signed)
 [facility] Patient Name: Denise Ruiz Procedure Date: 12/18/2023 MRN: [id number] Attending MD: Oliva Boots , MD, [id number] Date of Birth: 1975/07/29 CSN: [id number] Age: 48 Admit Type: Outpatient Procedure:                Upper GI endoscopy Indications:              Follow-up of esophageal reflux Providers:                Oliva Boots, MD, Jacquelyn Jaci Pierce, RN,                            Coye Bade, Technician Referring MD:              Medicines:                Monitored Anesthesia Care Complications:            No immediate complications. Estimated Blood Loss:     Estimated blood loss: none. Procedure:                Pre-Anesthesia Assessment:                           - Prior to the procedure, a History and Physical                            was performed, and patient medications and                            allergies were reviewed. The patient's tolerance of                            previous anesthesia was also reviewed. The risks                            and benefits of the procedure and the sedation                            options and risks were discussed with the patient.                            All questions were answered, and informed consent                            was obtained. Prior Anticoagulants: The patient has                            taken no anticoagulant or antiplatelet agents. ASA                            Grade Assessment: II - A patient with mild systemic                            disease. After reviewing the risks and benefits,  the patient was deemed in satisfactory condition to                            undergo the procedure.                           After obtaining informed consent, the endoscope was                            passed under direct vision. Throughout the                            procedure, the patient's blood pressure, pulse, and                             oxygen saturations were monitored continuously. The                            [id number]-[id number] ([id number]) Olympus endoscope was introduced                            through the mouth, and advanced to the third part                            of duodenum. The upper GI endoscopy was                            accomplished without difficulty. The patient                            tolerated the procedure well. Scope In: Scope Out: Findings:      A small hiatal hernia was present.      The entire examined stomach was normal.      The ampulla, duodenal bulb, first portion of the duodenum, second       portion of the duodenum, major papilla and third portion of the duodenum       were normal.      The cardia and gastric fundus were normal on retroflexion. Impression:               - Small hiatal hernia.                           - Normal stomach.                           - Normal ampulla, duodenal bulb, first portion of                            the duodenum, second portion of the duodenum, major                            papilla and third portion of the duodenum.                           - No specimens  collected. Moderate Sedation:      Not Applicable - Patient had care per Anesthesia. Recommendation:           - Patient has a contact number available for                            emergencies. The signs and symptoms of potential                            delayed complications were discussed with the                            patient. Return to normal activities tomorrow.                            Written discharge instructions were provided to the                            patient.                           - Soft diet today.                           - Continue present medications.                           - Await pathology results.                           - Return to GI clinic PRN.                           - Telephone GI clinic for pathology results in 1                             week.                           - Telephone GI clinic if symptomatic PRN.                           - Perform a colonoscopy today. Procedure Code(s):        --- Professional ---                           [phone], Esophagogastroduodenoscopy, flexible,                            transoral; diagnostic, including collection of                            specimen(s) by brushing or washing, when performed                            (separate procedure) Diagnosis Code(s):        --- Professional ---  K44.9, Diaphragmatic hernia without obstruction or                            gangrene                           K21.9, Gastro-esophageal reflux disease without                            esophagitis CPT copyright 2022 American Medical Association. All rights reserved. The codes documented in this report are preliminary and upon coder review may  be revised to meet current compliance requirements. Oliva Boots, MD 12/18/2023 11:08:50 AM This report has been signed electronically. Number of Addenda: 0

## 2023-12-18 NOTE — Anesthesia Preprocedure Evaluation (Addendum)
 Anesthesia Evaluation  Patient identified by MRN, date of birth, ID band Patient awake    Reviewed: Allergy & Precautions, NPO status , Patient's Chart, lab work & pertinent test results  Airway Mallampati: III  TM Distance: >3 FB Neck ROM: Full    Dental  (+) Teeth Intact, Dental Advisory Given   Pulmonary neg pulmonary ROS   Pulmonary exam normal breath sounds clear to auscultation       Cardiovascular negative cardio ROS Normal cardiovascular exam Rhythm:Regular Rate:Normal     Neuro/Psych  Neuromuscular disease  negative psych ROS   GI/Hepatic Neg liver ROS,GERD  Medicated,,Chronic gastritis    Endo/Other  negative endocrine ROS    Renal/GU negative Renal ROS     Musculoskeletal negative musculoskeletal ROS (+)    Abdominal   Peds  Hematology negative hematology ROS (+)   Anesthesia Other Findings Day of surgery medications reviewed with the patient.  Reproductive/Obstetrics                              Anesthesia Physical Anesthesia Plan  ASA: 2  Anesthesia Plan: MAC   Post-op Pain Management:    Induction: Intravenous  PONV Risk Score and Plan: 2 and TIVA and Treatment may vary due to age or medical condition  Airway Management Planned: Natural Airway and Simple Face Mask  Additional Equipment:   Intra-op Plan:   Post-operative Plan:   Informed Consent: I have reviewed the patients History and Physical, chart, labs and discussed the procedure including the risks, benefits and alternatives for the proposed anesthesia with the patient or authorized representative who has indicated his/her understanding and acceptance.     Dental advisory given and Interpreter used for interview  Plan Discussed with: CRNA and Anesthesiologist  Anesthesia Plan Comments:          Anesthesia Quick Evaluation

## 2023-12-18 NOTE — Anesthesia Postprocedure Evaluation (Signed)
 Anesthesia Post Note  Patient: Marketing executive  Procedure(s) Performed: COLONOSCOPY EGD (ESOPHAGOGASTRODUODENOSCOPY)     Patient location during evaluation: Endoscopy Anesthesia Type: MAC Level of consciousness: oriented, awake and alert and awake Pain management: pain level controlled Vital Signs Assessment: post-procedure vital signs reviewed and stable Respiratory status: spontaneous breathing, nonlabored ventilation, respiratory function stable and patient connected to nasal cannula oxygen Cardiovascular status: blood pressure returned to baseline and stable Postop Assessment: no headache, no backache and no apparent nausea or vomiting Anesthetic complications: no   No notable events documented.  Last Vitals:  Vitals:   12/18/23 1110 12/18/23 1120  BP: (!) 95/58 112/74  Pulse: (!) 47 (!) 51  Resp: (!) 26 (!) 22  Temp: 36.4 C   SpO2: 100% 100%    Last Pain:  Vitals:   12/18/23 1110  TempSrc:   PainSc: 0-No pain                 Garnette FORBES Skillern

## 2023-12-18 NOTE — Discharge Instructions (Addendum)
 Call if GI question or problem otherwise begin with soft solids and slowly advance your diet and call for biopsy report in 1 week and follow-up in the office as needed.  YOU HAD AN ENDOSCOPIC PROCEDURE TODAY: Refer to the procedure report and other information in the discharge instructions given to you for any specific questions about what was found during the examination. If this information does not answer your questions, please call [facility] office at [phone] to clarify.   YOU SHOULD EXPECT: Some feelings of bloating in the abdomen. Passage of more gas than usual. Walking can help get rid of the air that was put into your GI tract during the procedure and reduce the bloating. If you had a lower endoscopy (such as a colonoscopy or flexible sigmoidoscopy) you may notice spotting of blood in your stool or on the toilet paper. Some abdominal soreness may be present for a day or two, also.  DIET: Your first meal following the procedure should be a light meal and then it is ok to progress to your normal diet. A half-sandwich or bowl of soup is an example of a good first meal. Heavy or fried foods are harder to digest and may make you feel nauseous or bloated. Drink plenty of fluids but you should avoid alcoholic beverages for 24 hours. If you had a esophageal dilation, please see attached instructions for diet.    ACTIVITY: Your care partner should take you home directly after the procedure. You should plan to take it easy, moving slowly for the rest of the day. You can resume normal activity the day after the procedure however YOU SHOULD NOT DRIVE, use power tools, machinery or perform tasks that involve climbing or major physical exertion for 24 hours (because of the sedation medicines used during the test).   SYMPTOMS TO REPORT IMMEDIATELY: A gastroenterologist can be reached at any hour. Please call [phone]  for any of the following symptoms:  Following lower endoscopy (colonoscopy, flexible  sigmoidoscopy) Excessive amounts of blood in the stool  Significant tenderness, worsening of abdominal pains  Swelling of the abdomen that is new, acute  Fever of 100 or higher  Following upper endoscopy (EGD, EUS, ERCP, esophageal dilation) Vomiting of blood or coffee ground material  New, significant abdominal pain  New, significant chest pain or pain under the shoulder blades  Painful or persistently difficult swallowing  New shortness of breath  Black, tarry-looking or red, bloody stools  FOLLOW UP:  If any biopsies were taken you will be contacted by phone or by letter within the next 1-3 weeks. Call [phone]  if you have not heard about the biopsies in 3 weeks.  Please also call with any specific questions about appointments or follow up tests. YOU HAD AN ENDOSCOPIC PROCEDURE TODAY: Refer to the procedure report and other information in the discharge instructions given to you for any specific questions about what was found during the examination. If this information does not answer your questions, please call [facility] office at [phone] to clarify.

## 2023-12-19 ENCOUNTER — Encounter (HOSPITAL_COMMUNITY): Payer: Self-pay | Admitting: Gastroenterology

## 2023-12-19 LAB — SURGICAL PATHOLOGY

## 2023-12-25 ENCOUNTER — Telehealth: Payer: Self-pay | Admitting: Internal Medicine

## 2023-12-25 NOTE — Telephone Encounter (Signed)
 Patient needs an appointment because she feels a bump on her right breast .  Patient noticed the bump two days ago, bump has stayed same size, no itchiness , no discharge ,   Only feels bump at touch .   Patient states she has had same bumps on her left breast.   We will call patient when there is a cancellation.

## 2023-12-26 ENCOUNTER — Ambulatory Visit: Payer: Self-pay | Admitting: Internal Medicine

## 2023-12-26 VITALS — BP 92/60 | HR 62 | Resp 17 | Ht <= 58 in | Wt 123.0 lb

## 2023-12-26 DIAGNOSIS — N814 Uterovaginal prolapse, unspecified: Secondary | ICD-10-CM

## 2023-12-26 DIAGNOSIS — N6011 Diffuse cystic mastopathy of right breast: Secondary | ICD-10-CM

## 2023-12-26 DIAGNOSIS — N8110 Cystocele, unspecified: Secondary | ICD-10-CM

## 2023-12-26 NOTE — Progress Notes (Signed)
 Subjective:    Patient ID: Denise Ruiz, female    DOB: March 09, 1976, 48 y.o.   MRN: [id number]  I, doctor in training under Dr. Adella, did initial H/P then presented to Dr. Adella who also evaluated pt and formulated final A/P, put in referrals and f/u appts.   Both I and Dr. Adella participated in pt education and discharge instructions.  Pt expressed understanding.   Erminio Bloomer interpreted after initial H/P by me. Interpretor for my portion of H/P was: valentin [id number] CC- right breast mass  HPI- 3 days of right breast mass which was bigger yesterday but shrinking today. Maybe because it is near my period. Period due today or tomorrow. Had this before but in the left  breast but had negative mammo/US ; told it was cyst and not dangerous. I think it might be same. Pain a little only when you touch.  Today no pain. Noticed it 3 days again. Period may come today or tomorrow.  LMP 11/23/23.   No fever.  Ros-  GYN - something coming out of vagina for 1 month.   No pain. No past history. It feels uncomfortable. It goes back in when she lies down and puts her feet up.  Worsens with heavy lifting.  4 vaginal deliveries. No  vaginal surgeries.   Associated slight dysyparunia.  No GYN surgeries. Noticed it when having a bath.  No bleeding. Regualr periods.  Last pap? does not recall.  Last vaginal delivery 8 years.   No vaginal discharge or unusual bleeding.  No preceding even recalled.   Feels like she is swollen .there  Dec in walking because it might come out. +h/o uterine fibroids per 2023 US , currently stable.  GU- urination - more frequent maybe.  Not enough force. Thinks enough comes out.  Difficulty starting urination, maintaining it ( a little) or if she wants to stop it, she has difficulty.  Bladder feels full. If she jumps and makes sudden movement and coughing and lifts, there is leakage. No dysurea.   Consititutional-appetite good.  No wt loss.  MS- Low back pain for  more than 3 years. In therapy.  Better.  At baseline.  Neuro- No numbness in legs or toes.  GI- recent  colonscopy and upper GI which showed +hernia in the stomach area; not sure if she can take ibuprofen  Skin- small bruise outer aspect of right breast that was noted during exam and querried.  Pt has no recall of how she got that.   Past Medical History: No date: Bilateral ovarian cysts 09/06/2020: Chronic gastritis     Comment:  On biopsy of antrum, Dr. Rosalie, [facility] No date: Chronic sinusitis No date: GERD (gastroesophageal reflux disease) No date: Hidradenitis axillaris No date: Hyperlipidemia No date: Varicose vein   Patient Active Problem List   Abnormal uterine bleeding        Date Noted: 10/11/2022    Facial skin lesion        Date Noted: 10/11/2022    Perimenopause        Date Noted: 10/11/2022    Breast pain, left        Date Noted: 02/23/2022    Chronic left-sided thoracic back pain        Date Noted: 02/23/2022    Decreased visual acuity        Date Noted: 04/08/2021    Ulnar nerve entrapment        Date Noted: 04/08/2021    Stress  Date Noted: 12/26/2020    Chronic gastritis        Date Noted: 09/06/2020    Other irritable bowel syndrome        Date Noted: 06/09/2020    Cervical polyp        Date Noted: 03/05/2020    Helicobacter pylori ab+        Date Noted: 01/27/2020    Gastroesophageal reflux disease        Date Noted: 01/27/2020    Varicose veins of both lower extremities with pain        Date Noted: 01/05/2020    Greater trochanteric bursitis of both hips        Date Noted: 01/05/2020    Presbyopia        Date Noted: 04/03/2019    Class 1 obesity without serious comorbidity with body mass index (BMI) of 30.0 to 30.9 in adult        Date Noted: 04/03/2019    Hidradenitis axillaris    Dyslipidemia    Hyperglycemia        Date Noted: 12/19/2018    Mixed hyperlipidemia        Date Noted: 12/19/2018    Environmental and  seasonal allergies        Date Noted: 12/19/2018    Hidradenitis suppurativa        Date Noted: 09/19/2018    Foot pain, left        Date Noted: 09/19/2018    Cellulitis of right axilla        Date Noted: 07/19/2018    Chronic sinusitis    Bilateral ovarian cysts    BV (bacterial vaginosis)        Date Noted: 03/03/2015    Left axillary pain        Date Noted: 06/03/2013     Past Surgical History: 08/30/2020: BIOPSY     Comment:  Procedure: BIOPSY;  Surgeon: Rosalie Kitchens, MD;  Location:              [facility];  Service: Endoscopy;; 12/18/2023: COLONOSCOPY; N/A     Comment:  Procedure: COLONOSCOPY;  Surgeon: Rosalie Kitchens, MD;                Location: [facility];  Service: Gastroenterology;                Laterality: N/A; 12/18/2023: ESOPHAGOGASTRODUODENOSCOPY; N/A     Comment:  Procedure: EGD (ESOPHAGOGASTRODUODENOSCOPY);  Surgeon:               Rosalie Kitchens, MD;  Location: [facility];  Service:               Gastroenterology;  Laterality: N/A;  needs spanish               translator 08/30/2020: ESOPHAGOGASTRODUODENOSCOPY (EGD) WITH PROPOFOL ; N/A     Comment:  Procedure: ESOPHAGOGASTRODUODENOSCOPY (EGD) WITH               PROPOFOL ;  Surgeon: Rosalie Kitchens, MD;  Location: [facility];  Service: Endoscopy;  Laterality: N/A; 04/11/2023: IR RADIOLOGIST EVAL & MGMT  Pertinent Studies in EPIC - Mammo 3 months ago neg - US  left  Breast 12/23 for left breast cyst- fibrocystic changes and benign cyst - US  left breasts 2022- neg - [facility]; did not have symptoms at that time. Pt does not know when her last  pelvic exam was.  FHx- +h/o Breast Ca in cousins but not sisters.   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 Thinks work is causing it.   Says she can still work though but does not put too much pressure on the area       Objective:    Vitals:   12/26/23 1202  BP: 92/60  Pulse: 62   Resp: 17    BP noted on the low side; at baseline  Physical Exam Vitals and nursing note reviewed. Exam conducted with a chaperone present.  Constitutional:      General: She is not in acute distress.    Appearance: Normal appearance. She is not ill-appearing, toxic-appearing or diaphoretic.     Comments: Weight higher than normal  HENT:     Head: Normocephalic.     Ears:     Comments: Grossly normal hearing    Nose: Nose normal.  Eyes:     General: No scleral icterus. Pulmonary:     Comments: Normal resp effrort Chest:       Comments: 1 by 1 inch, firm, fibrocystic per Dr. Adella, mass that is not freely movable but seems to be breast tissue itself.   No redness or swelling around it. No dimpling . No nipple discharge or changes of nipple.   Non-tender, non-fluctuant. No peau d'Orange.   The firmness is compared to the  left breast and is definitely firmer. The rest of breast tissue without significant changes.  Abdominal:     General: There is no distension.     Palpations: Abdomen is soft. There is no mass (bladder area nondistended).     Tenderness: There is no abdominal tenderness.  Genitourinary:    Comments: With Dr. Adella:  exmined when pt is lying down: pt has bulging of pelvic structures with bearing down, possibly vagina, rectum, bladder which go back in when pt stops bearing down. Musculoskeletal:     Comments: No gross abnormality  Lymphadenopathy:     Upper Body:     Right upper body: No axillary adenopathy.  Skin:    Findings: Bruising (small 1/2 cm bruise of the out quadrant of the breast.) present.     Comments: No obvious rashes, redness, drainage and discharge from the breast.  Neurological:     General: No focal deficit present.     Mental Status: She is alert.  Psychiatric:        Mood and Affect: Mood normal.        Behavior: Behavior normal.        Thought Content: Thought content normal.        Judgment: Judgment normal.           Assessment & Plan:  1- Acute new right breast mass with normal Mammo 3 months ago; left breast US  showing fibrocystic changes and a benign cyst in 2023.  No right breast US . Pt near her period.    C/W firbosystic breast disease per Dr. Adella.    Monitor for a month.  If further changes or concerns, RTC sooner, otherwise RTC in 1 month.   2-Vaginal/ ?uterine/ bladder/ ?rectal prolapse with urinary sxs that consists of frequency, less force in starting and maintaining urination, urinary retention but no obstruction-   Quality of life moderately affected.  Maybe mass affect from Uterine fibroids +/- frequent straining at work, +h/o 4 vaginal deliveries and  being peri-menopausal resulting in weakened  pelvic muscles.  - Pt educated; continue comfort measures of lying on back and avoiding heavy lifting. -GYN referral to [facility] for more accurate diagnosis, cause and management. -RTC PRN.

## 2023-12-26 NOTE — Telephone Encounter (Signed)
 Patient has been scheduled

## 2024-01-24 ENCOUNTER — Ambulatory Visit: Payer: Self-pay | Admitting: Internal Medicine

## 2024-01-26 ENCOUNTER — Encounter: Payer: Self-pay | Admitting: Internal Medicine

## 2024-03-11 NOTE — Progress Notes (Signed)
 Patient seen with Dr Fleta.  Agree with plan   Right breast concerns:   On exam, no abnormal tissue palpated, felt to be normal glandular breast tissue.     2.  Cystocele/rectocele and concern for uterine prolapse:  referral to gyn.

## 2024-09-23 ENCOUNTER — Encounter: Payer: Self-pay | Admitting: Internal Medicine
